# Patient Record
Sex: Female | Born: 1965 | Race: White | Marital: Married | Smoking: Current every day smoker
Health system: Southern US, Community
[De-identification: ages and names within clinical notes are randomized; demographics above are authoritative.]

## PROBLEM LIST (undated history)

## (undated) DIAGNOSIS — I10 Essential (primary) hypertension: Secondary | ICD-10-CM

## (undated) DIAGNOSIS — N200 Calculus of kidney: Secondary | ICD-10-CM

## (undated) DIAGNOSIS — K625 Hemorrhage of anus and rectum: Secondary | ICD-10-CM

## (undated) DIAGNOSIS — R51 Headache: Secondary | ICD-10-CM

## (undated) DIAGNOSIS — M549 Dorsalgia, unspecified: Secondary | ICD-10-CM

## (undated) HISTORY — PX: APPENDECTOMY: SHX54

## (undated) HISTORY — PX: ABDOMINAL HYSTERECTOMY: SHX81

## (undated) HISTORY — PX: BREAST LUMPECTOMY: SHX2

---

## 2004-12-23 HISTORY — PX: BREAST EXCISIONAL BIOPSY: SUR124

## 2012-09-15 ENCOUNTER — Ambulatory Visit
Admission: RE | Admit: 2012-09-15 | Discharge: 2012-09-15 | Disposition: A | Discharge status: Home / Self Care | Payer: No Typology Code available for payment source | Source: Ambulatory Visit | Attending: Family Medicine | Admitting: Family Medicine

## 2012-09-15 ENCOUNTER — Other Ambulatory Visit: Payer: Self-pay | Admitting: Family Medicine

## 2012-09-15 DIAGNOSIS — R109 Unspecified abdominal pain: Secondary | ICD-10-CM

## 2012-09-15 DIAGNOSIS — R1110 Vomiting, unspecified: Secondary | ICD-10-CM

## 2012-09-15 MED ORDER — IOHEXOL 300 MG/ML  SOLN
100.0000 mL | Freq: Once | INTRAMUSCULAR | Status: AC | PRN
  Administered 2012-09-15: 100 mL via INTRAVENOUS

## 2012-09-22 ENCOUNTER — Encounter (HOSPITAL_COMMUNITY): Payer: Self-pay | Admitting: Anesthesiology

## 2012-09-22 ENCOUNTER — Encounter (HOSPITAL_COMMUNITY)
Admission: RE | Disposition: A | Discharge status: Home / Self Care | Payer: Self-pay | Source: Ambulatory Visit | Attending: Gastroenterology

## 2012-09-22 ENCOUNTER — Encounter (HOSPITAL_COMMUNITY): Payer: Self-pay

## 2012-09-22 ENCOUNTER — Ambulatory Visit (HOSPITAL_COMMUNITY): Payer: Self-pay | Admitting: Anesthesiology

## 2012-09-22 ENCOUNTER — Ambulatory Visit (HOSPITAL_COMMUNITY)
Admission: RE | Admit: 2012-09-22 | Discharge: 2012-09-22 | Disposition: A | Discharge status: Home / Self Care | Payer: Self-pay | Source: Ambulatory Visit | Attending: Gastroenterology | Admitting: Gastroenterology

## 2012-09-22 DIAGNOSIS — K5730 Diverticulosis of large intestine without perforation or abscess without bleeding: Secondary | ICD-10-CM | POA: Insufficient documentation

## 2012-09-22 DIAGNOSIS — R109 Unspecified abdominal pain: Secondary | ICD-10-CM | POA: Insufficient documentation

## 2012-09-22 DIAGNOSIS — I10 Essential (primary) hypertension: Secondary | ICD-10-CM | POA: Insufficient documentation

## 2012-09-22 DIAGNOSIS — K648 Other hemorrhoids: Secondary | ICD-10-CM | POA: Insufficient documentation

## 2012-09-22 DIAGNOSIS — K921 Melena: Secondary | ICD-10-CM | POA: Insufficient documentation

## 2012-09-22 DIAGNOSIS — F17200 Nicotine dependence, unspecified, uncomplicated: Secondary | ICD-10-CM | POA: Insufficient documentation

## 2012-09-22 DIAGNOSIS — R197 Diarrhea, unspecified: Secondary | ICD-10-CM | POA: Insufficient documentation

## 2012-09-22 DIAGNOSIS — D126 Benign neoplasm of colon, unspecified: Secondary | ICD-10-CM | POA: Insufficient documentation

## 2012-09-22 HISTORY — DX: Dorsalgia, unspecified: M54.9

## 2012-09-22 HISTORY — DX: Headache: R51

## 2012-09-22 HISTORY — DX: Essential (primary) hypertension: I10

## 2012-09-22 HISTORY — DX: Hemorrhage of anus and rectum: K62.5

## 2012-09-22 HISTORY — DX: Calculus of kidney: N20.0

## 2012-09-22 HISTORY — PX: COLONOSCOPY: SHX5424

## 2012-09-22 SURGERY — COLONOSCOPY
Anesthesia: Monitor Anesthesia Care | Site: Rectum

## 2012-09-22 MED ORDER — SODIUM CHLORIDE 0.9 % IV SOLN: INTRAVENOUS | Status: DC

## 2012-09-22 MED ORDER — DIPHENHYDRAMINE HCL 50 MG/ML IJ SOLN
INTRAMUSCULAR | Status: AC
  Filled 2012-09-22: qty 1

## 2012-09-22 MED ORDER — MIDAZOLAM HCL 10 MG/2ML IJ SOLN
INTRAMUSCULAR | Status: AC
  Filled 2012-09-22: qty 4

## 2012-09-22 MED ORDER — FENTANYL CITRATE 0.05 MG/ML IJ SOLN
INTRAMUSCULAR | Status: DC | PRN
  Administered 2012-09-22: 25 ug via INTRAVENOUS

## 2012-09-22 MED ORDER — MIDAZOLAM HCL 5 MG/5ML IJ SOLN
INTRAMUSCULAR | Status: DC | PRN
  Administered 2012-09-22: 2 mg via INTRAVENOUS

## 2012-09-22 MED ORDER — FENTANYL CITRATE 0.05 MG/ML IJ SOLN
INTRAMUSCULAR | Status: AC
  Filled 2012-09-22: qty 4

## 2012-09-22 NOTE — Anesthesia Preprocedure Evaluation (Deleted)
Anesthesia Evaluation  Patient identified by MRN, date of birth, ID band Patient awake    Reviewed: Allergy & Precautions, H&P , NPO status , Patient's Chart, lab work & pertinent test results  Airway Mallampati: II TM Distance: >3 FB Neck ROM: Full    Dental  (+) Edentulous Upper and Dental Advisory Given   Pulmonary Current Smoker,  breath sounds clear to auscultation  Pulmonary exam normal       Cardiovascular hypertension, Pt. on medications - CAD, - Past MI and - CABG Rhythm:Regular Rate:Normal     Neuro/Psych  Headaches, negative psych ROS   GI/Hepatic negative GI ROS, Neg liver ROS,   Endo/Other  negative endocrine ROS  Renal/GU Renal disease     Musculoskeletal negative musculoskeletal ROS (+)   Abdominal   Peds  Hematology negative hematology ROS (+)   Anesthesia Other Findings   Reproductive/Obstetrics                          Anesthesia Physical Anesthesia Plan  ASA: II  Anesthesia Plan: MAC   Post-op Pain Management:    Induction:   Airway Management Planned:   Additional Equipment:   Intra-op Plan:   Post-operative Plan:   Informed Consent: I have reviewed the patients History and Physical, chart, labs and discussed the procedure including the risks, benefits and alternatives for the proposed anesthesia with the patient or authorized representative who has indicated his/her understanding and acceptance.   Dental advisory given  Plan Discussed with: CRNA and Surgeon  Anesthesia Plan Comments:         Anesthesia Quick Evaluation

## 2012-09-22 NOTE — Progress Notes (Addendum)
After giving one dose fentanyl plus versed 2mg  patient developed reddened area around IV site with burning and welts MD notified no further drugs given. Anesthesia department will give sedation later today.Due to previous add nos. Patient unable to be done today. Procedure rescheduled for 1215 on Wednesday October 2nd 2013

## 2012-09-22 NOTE — H&P (Signed)
Patient interval history reviewed.  Patient examined again.  There has been no change from documented H/P dated 09/18/12 (scanned into chart from our office) except as documented above.  Assessment:  1.  Left- sided abdominal pain. 2.  Blood in stool. 3.  Abnormal CT abdomen.  Sigmoid diverticulosis without diverticulitis.  Plan:  1.  Colonoscopy. 2.  Risks (bleeding, infection, bowel perforation that could require surgery, sedation-related changes in cardiopulmonary systems), benefits (identification and possible treatment of source of symptoms, exclusion of certain causes of symptoms), and alternatives (watchful waiting, radiographic imaging studies, empiric medical treatment) of colonoscopy were explained to patient in detail and she wishes to proceed.

## 2012-09-22 NOTE — Progress Notes (Signed)
A second IV started in preprocedure area . #22 Q cath inserted left inner forearm. IV flowing with difficulty. Patient's procedure will be done with anesthesia . Patient's procedure cancelled at 1330 rescheduled for Wednesday October 2nd at 1230 patient to arrive at 1030 am. IV fluids infused today 250cc. Home instructions reviewde with patient prior to discharge.

## 2012-09-23 ENCOUNTER — Ambulatory Visit (HOSPITAL_COMMUNITY)
Admission: RE | Admit: 2012-09-23 | Discharge: 2012-09-23 | Disposition: A | Discharge status: Home / Self Care | Payer: Self-pay | Source: Ambulatory Visit | Attending: Gastroenterology | Admitting: Gastroenterology

## 2012-09-23 ENCOUNTER — Encounter (HOSPITAL_COMMUNITY): Payer: Self-pay | Admitting: Gastroenterology

## 2012-09-23 ENCOUNTER — Ambulatory Visit (HOSPITAL_COMMUNITY): Payer: Self-pay | Admitting: Anesthesiology

## 2012-09-23 ENCOUNTER — Encounter (HOSPITAL_COMMUNITY): Payer: Self-pay | Admitting: Anesthesiology

## 2012-09-23 ENCOUNTER — Encounter (HOSPITAL_COMMUNITY)
Admission: RE | Disposition: A | Discharge status: Home / Self Care | Payer: Self-pay | Source: Ambulatory Visit | Attending: Gastroenterology

## 2012-09-23 DIAGNOSIS — R109 Unspecified abdominal pain: Secondary | ICD-10-CM | POA: Insufficient documentation

## 2012-09-23 DIAGNOSIS — K921 Melena: Secondary | ICD-10-CM | POA: Insufficient documentation

## 2012-09-23 DIAGNOSIS — R197 Diarrhea, unspecified: Secondary | ICD-10-CM | POA: Insufficient documentation

## 2012-09-23 DIAGNOSIS — D126 Benign neoplasm of colon, unspecified: Secondary | ICD-10-CM | POA: Insufficient documentation

## 2012-09-23 DIAGNOSIS — K648 Other hemorrhoids: Secondary | ICD-10-CM | POA: Insufficient documentation

## 2012-09-23 SURGERY — COLONOSCOPY WITH PROPOFOL
Anesthesia: Monitor Anesthesia Care

## 2012-09-23 MED ORDER — KETAMINE HCL 50 MG/ML IJ SOLN
INTRAMUSCULAR | Status: DC | PRN
  Administered 2012-09-23: 50 mg via INTRAMUSCULAR

## 2012-09-23 MED ORDER — SODIUM CHLORIDE 0.9 % IV SOLN: INTRAVENOUS | Status: DC

## 2012-09-23 MED ORDER — PROPOFOL 10 MG/ML IV BOLUS
INTRAVENOUS | Status: DC | PRN
  Administered 2012-09-23: 40 mg via INTRAVENOUS
  Administered 2012-09-23: 20 mg via INTRAVENOUS
  Administered 2012-09-23: 30 mg via INTRAVENOUS

## 2012-09-23 MED ORDER — PROPOFOL 10 MG/ML IV EMUL
INTRAVENOUS | Status: DC | PRN
  Administered 2012-09-23: 100 ug/kg/min via INTRAVENOUS

## 2012-09-23 MED ORDER — LACTATED RINGERS IV SOLN
INTRAVENOUS | Status: DC
  Administered 2012-09-23: 1000 mL via INTRAVENOUS

## 2012-09-23 MED ORDER — DICYCLOMINE HCL 10 MG PO CAPS: 10.0000 mg | ORAL_CAPSULE | Freq: Three times a day (TID) | ORAL | Status: DC

## 2012-09-23 SURGICAL SUPPLY — 21 items

## 2012-09-23 NOTE — Transfer of Care (Signed)
Immediate Anesthesia Transfer of Care Note  Patient: Aimee Ramirez  Procedure(s) Performed: Procedure(s) (LRB): COLONOSCOPY WITH PROPOFOL (N/A)  Patient Location: PACU  Anesthesia Type: MAC  Level of Consciousness: sedated, patient cooperative and responds to stimulaton  Airway & Oxygen Therapy: Patient Spontanous Breathing and Patient connected to face mask oxgen  Post-op Assessment: Report given to PACU RN and Post -op Vital signs reviewed and stable  Post vital signs: Reviewed and stable  Complications: No apparent anesthesia complications

## 2012-09-23 NOTE — Anesthesia Preprocedure Evaluation (Signed)
Anesthesia Evaluation  Patient identified by MRN, date of birth, ID band Patient awake    Reviewed: Allergy & Precautions, H&P , NPO status , Patient's Chart, lab work & pertinent test results  Airway Mallampati: II TM Distance: >3 FB Neck ROM: Full    Dental  (+) Edentulous Upper and Dental Advisory Given   Pulmonary Current Smoker,  breath sounds clear to auscultation  Pulmonary exam normal       Cardiovascular hypertension, Pt. on medications - CAD, - Past MI and - CABG Rhythm:Regular Rate:Normal     Neuro/Psych  Headaches, negative psych ROS   GI/Hepatic negative GI ROS, Neg liver ROS,   Endo/Other  negative endocrine ROS  Renal/GU Renal disease     Musculoskeletal negative musculoskeletal ROS (+)   Abdominal   Peds  Hematology negative hematology ROS (+)   Anesthesia Other Findings   Reproductive/Obstetrics                           Anesthesia Physical  Anesthesia Plan  ASA: II  Anesthesia Plan: MAC   Post-op Pain Management:    Induction: Intravenous  Airway Management Planned:   Additional Equipment:   Intra-op Plan:   Post-operative Plan:   Informed Consent: I have reviewed the patients History and Physical, chart, labs and discussed the procedure including the risks, benefits and alternatives for the proposed anesthesia with the patient or authorized representative who has indicated his/her understanding and acceptance.   Dental advisory given  Plan Discussed with: CRNA and Surgeon  Anesthesia Plan Comments:         Anesthesia Quick Evaluation

## 2012-09-23 NOTE — H&P (Signed)
Patient interval history reviewed.  Patient examined again.  There has been no change from documented H/P dated 09/18/12 (scanned into chart from our office) except as documented above.  Assessment:  1.  Bloody diarrhea. 2.  Diverticulosis without diverticulitis on recent CT scan.  Plan:  1.  Colonoscopy. 2.  Risks (bleeding, infection, bowel perforation that could require surgery, sedation-related changes in cardiopulmonary systems), benefits (identification and possible treatment of source of symptoms, exclusion of certain causes of symptoms), and alternatives (watchful waiting, radiographic imaging studies, empiric medical treatment) of colonoscopy were explained to patient in detail and patient wishes to proceed.

## 2012-09-23 NOTE — Op Note (Addendum)
[facility] [address] [place], [address]   COLONOSCOPY PROCEDURE REPORT  PATIENT: Aimee Ramirez, Aimee Ramirez  MR#: [id number] BIRTHDATE: 1966-05-16 , 45  yrs. old GENDER: Female ENDOSCOPIST: Willis Modena, MD REFERRED XB:[id number] Katrinka Blazing, M.D. PROCEDURE DATE:  09/23/2012 PROCEDURE:   Colonoscopy with biopsy and Colonoscopy with snare polypectomy ASA CLASS:   Class II INDICATIONS:unexplained diarrhea, hematochezia, and abdominal pain.  MEDICATIONS: MAC sedation, administered by CRNA DESCRIPTION OF PROCEDURE:   After the risks benefits and alternatives of the procedure were thoroughly explained, informed consent was obtained.  A digital rectal exam revealed no abnormalities of the rectum.   The     endoscope was introduced through the anus and advanced to the cecum, which was identified by both the appendix and ileocecal valve. No adverse events experienced.   The quality of the prep was good.  The instrument was then slowly withdrawn as the colon was fully examined.   Findings:  Normal digital rectal exam.  Prep quality good.  Few scattered left-sided diverticula.  10mm pedunculated sigmoid polyp, removed with snare cautery.  Sessile 5mm sigmoid polyp, removed with snare cautery.  5mm rectal polyp, removed with snare cautery. 3mm ascending colon polyp, removed with cold biopsy forceps.  No other masses, vascular ectasias, of inflammatory changes were seen.       Retroflexed views revealed internal hemorrhoids. Withdrawal time was over 10 minutes     .  The scope was withdrawn and the procedure completed.  ENDOSCOPIC IMPRESSION:     As above.  Suspect bleeding is from internal hemorrhoids, but polyps could potentially play role.   RECOMMENDATIONS:     1.  Watch for potential complications of procedure. 2.  Await polypectomy results. 3.  Topical therapies (e.g., Preparation-H) for hemorrhoidal bleeding. 4.  Timing of next colonoscopy pending polypectomy  findings. 5.  High fiber diet for diverticulosis. 6.  Trial of antispasmodics for abdominal pain. 7.  Follow-up with [facility] in 4-6 weeks.  eSigned:  Willis Modena, MD 09/23/2012 12:20 PM Revised: 09/23/2012 12:20 PMcc:

## 2012-09-23 NOTE — Anesthesia Postprocedure Evaluation (Signed)
  Anesthesia Post-op Note  Patient: Aimee Ramirez  Procedure(s) Performed: Procedure(s) (LRB): COLONOSCOPY WITH PROPOFOL (N/A)  Patient Location: PACU  Anesthesia Type: MAC  Level of Consciousness: awake and alert   Airway and Oxygen Therapy: Patient Spontanous Breathing  Post-op Pain: mild  Post-op Assessment: Post-op Vital signs reviewed, Patient's Cardiovascular Status Stable, Respiratory Function Stable, Patent Airway and No signs of Nausea or vomiting  Post-op Vital Signs: stable  Complications: No apparent anesthesia complications

## 2013-02-11 ENCOUNTER — Ambulatory Visit
Admission: RE | Admit: 2013-02-11 | Discharge: 2013-02-11 | Disposition: A | Discharge status: Home / Self Care | Payer: 59 | Source: Ambulatory Visit | Attending: Family Medicine | Admitting: Family Medicine

## 2013-02-11 ENCOUNTER — Other Ambulatory Visit: Payer: Self-pay | Admitting: Family Medicine

## 2013-02-11 DIAGNOSIS — R059 Cough, unspecified: Secondary | ICD-10-CM

## 2013-02-11 DIAGNOSIS — R0602 Shortness of breath: Secondary | ICD-10-CM

## 2013-02-11 DIAGNOSIS — R05 Cough: Secondary | ICD-10-CM

## 2013-02-11 DIAGNOSIS — F17200 Nicotine dependence, unspecified, uncomplicated: Secondary | ICD-10-CM

## 2013-08-09 ENCOUNTER — Encounter (HOSPITAL_COMMUNITY): Payer: Self-pay | Admitting: Emergency Medicine

## 2013-08-09 ENCOUNTER — Emergency Department (HOSPITAL_COMMUNITY): Payer: 59

## 2013-08-09 ENCOUNTER — Emergency Department (HOSPITAL_COMMUNITY)
Admission: EM | Admit: 2013-08-09 | Discharge: 2013-08-09 | Disposition: A | Discharge status: Home / Self Care | Payer: 59 | Attending: Emergency Medicine | Admitting: Emergency Medicine

## 2013-08-09 DIAGNOSIS — Y9389 Activity, other specified: Secondary | ICD-10-CM | POA: Insufficient documentation

## 2013-08-09 DIAGNOSIS — Z8719 Personal history of other diseases of the digestive system: Secondary | ICD-10-CM | POA: Insufficient documentation

## 2013-08-09 DIAGNOSIS — F17200 Nicotine dependence, unspecified, uncomplicated: Secondary | ICD-10-CM | POA: Insufficient documentation

## 2013-08-09 DIAGNOSIS — R0781 Pleurodynia: Secondary | ICD-10-CM

## 2013-08-09 DIAGNOSIS — Z79899 Other long term (current) drug therapy: Secondary | ICD-10-CM | POA: Insufficient documentation

## 2013-08-09 DIAGNOSIS — S298XXA Other specified injuries of thorax, initial encounter: Secondary | ICD-10-CM | POA: Insufficient documentation

## 2013-08-09 DIAGNOSIS — Z87442 Personal history of urinary calculi: Secondary | ICD-10-CM | POA: Insufficient documentation

## 2013-08-09 DIAGNOSIS — I10 Essential (primary) hypertension: Secondary | ICD-10-CM | POA: Insufficient documentation

## 2013-08-09 DIAGNOSIS — Y929 Unspecified place or not applicable: Secondary | ICD-10-CM | POA: Insufficient documentation

## 2013-08-09 DIAGNOSIS — R296 Repeated falls: Secondary | ICD-10-CM | POA: Insufficient documentation

## 2013-08-09 MED ORDER — PROMETHAZINE HCL 25 MG PO TABS: 25.0000 mg | ORAL_TABLET | Freq: Four times a day (QID) | ORAL | Status: DC | PRN

## 2013-08-09 MED ORDER — HYDROCODONE-ACETAMINOPHEN 5-325 MG PO TABS
2.0000 | ORAL_TABLET | Freq: Four times a day (QID) | ORAL | Status: DC | PRN
Start: 2013-08-09 — End: 2014-05-23

## 2013-08-09 NOTE — ED Provider Notes (Signed)
CSN: [id number]     Arrival date & time 08/09/13  1251 History     First MD Initiated Contact with Patient 08/09/13 1306     Chief Complaint  Patient presents with  . Rib Injury   (Consider location/radiation/quality/duration/timing/severity/associated sxs/prior Treatment) HPI Comments: Patient is a 47 year old female who presents today with left rib pain after falling getting out of her bathtub yesterday. She reports that the pain began immediately after she fell. This is a sharp pain worse with inspiration and palpation. Pain does not radiate. She has taken ibuprofen without relief. She denies feeling short of breath, chest pain, Diaphoresis, fevers, chills, hemoptysis, nausea, vomiting, abdominal pain. She has broken her ribs in the past and this feels like a broken rib. She has not had any recent surgeries, history of PE or DVT, long trips.   The history is provided by the patient. No language interpreter was used.    Past Medical History  Diagnosis Date  . Headache(784.0)   . Hypertension   . Rectal bleeding   . Kidney stone   . Kidney stone   . Back pain    Past Surgical History  Procedure Laterality Date  . Abdominal hysterectomy    . Appendectomy    . Breast lumpectomy    . Colonoscopy  09/22/2012    Procedure: COLONOSCOPY;  Surgeon: Willis Modena, MD;  Location: [facility];  Service: Endoscopy;  Laterality: N/A;   History reviewed. No pertinent family history. History  Substance Use Topics  . Smoking status: Current Every Day Smoker -- 0.50 packs/day  . Smokeless tobacco: Not on file  . Alcohol Use: No   OB History   Grav Para Term Preterm Abortions TAB SAB Ect Mult Living                 Review of Systems  Constitutional: Negative for fever and chills.  Respiratory: Negative for shortness of breath.   Cardiovascular: Negative for chest pain.  Gastrointestinal: Negative for nausea, vomiting and abdominal pain.  All other systems reviewed and are  negative.    Allergies  Fentanyl; Toradol; Versed; and Ace inhibitors  Home Medications   Current Outpatient Rx  Name  Route  Sig  Dispense  Refill  . diltiazem (CARDIZEM CD) 240 MG 24 hr capsule   Oral   Take 240 mg by mouth daily.         Marland Kitchen HYDROcodone-acetaminophen (NORCO/VICODIN) 5-325 MG per tablet   Oral   Take 1 tablet by mouth every 6 (six) hours as needed.         . meloxicam (MOBIC) 7.5 MG tablet   Oral   Take 7.5 mg by mouth daily.          BP 188/114  Pulse 99  Temp(Src) 98.7 F (37.1 C) (Oral)  Resp 18  SpO2 100% Physical Exam  Nursing note and vitals reviewed. Constitutional: She is oriented to person, place, and time. She appears well-developed and well-nourished. No distress.  HENT:  Head: Normocephalic and atraumatic.  Right Ear: External ear normal.  Left Ear: External ear normal.  Nose: Nose normal.  Mouth/Throat: Oropharynx is clear and moist.  Eyes: Conjunctivae are normal.  Neck: Normal range of motion.  Cardiovascular: Normal rate, regular rhythm and normal heart sounds.   Pulmonary/Chest: Effort normal and breath sounds normal. No stridor. No respiratory distress. She has no decreased breath sounds. She has no wheezes. She has no rales.  Tender to palpation along the left sixth rib  Abdominal: Soft. She exhibits no distension. There is no tenderness.  Musculoskeletal: Normal range of motion.  Neurological: She is alert and oriented to person, place, and time. She has normal strength.  Skin: Skin is warm and dry. She is not diaphoretic. No erythema.  Psychiatric: She has a normal mood and affect. Her behavior is normal.    ED Course   Procedures (including critical care time)  Labs Reviewed - No data to display Dg Ribs Unilateral W/chest Left  08/09/2013   *RADIOLOGY REPORT*  Clinical Data: On bathtub.  Mid left side rib pain.  LEFT RIBS AND CHEST - 3+ VIEW  Comparison: Chest x-ray from 02/11/2013  Findings: The lungs are clear  without focal infiltrate, edema, pneumothorax or pleural effusion. The cardiopericardial silhouette is within normal limits for size.  Nodular density overlying the left base was present previously and is unchanged, most compatible with a nipple shadow.  No evidence for acute displaced left-sided rib fracture.  IMPRESSION: No evidence for acute left rib fracture.   Original Report Authenticated By: Kennith Center, M.D.   1. Rib pain on left side     MDM  Patient with left rib pain. X-ray negative for fracture, pneumothorax. Discussed with patient that it is possible to have a rib fracture without seen on the x-ray. Discussed the treatment would be the same. She was encouraged to take deep breaths every hour on the hour to prevent pneumonia, atelectasis. Perc negative. She was given pain medication. Return instructions given. Vital signs stable for discharge. Patient / Family / Caregiver informed of clinical course, understand medical decision-making process, and agree with plan.   Mora Bellman, PA-C 08/10/13 1225

## 2013-08-09 NOTE — ED Notes (Signed)
Patient reports that she fell in her shower yesterday and had had left rib pain since

## 2013-08-10 NOTE — ED Provider Notes (Signed)
Medical screening examination/treatment/procedure(s) were performed by non-physician practitioner and as supervising physician I was immediately available for consultation/collaboration.    Lakota Schweppe R Kayla Weekes, MD 08/10/13 1541 

## 2013-12-28 ENCOUNTER — Ambulatory Visit (HOSPITAL_BASED_OUTPATIENT_CLINIC_OR_DEPARTMENT_OTHER): Payer: 59

## 2014-01-24 ENCOUNTER — Ambulatory Visit (HOSPITAL_BASED_OUTPATIENT_CLINIC_OR_DEPARTMENT_OTHER): Payer: 59 | Attending: Physical Medicine and Rehabilitation | Admitting: *Deleted

## 2014-01-24 DIAGNOSIS — G4733 Obstructive sleep apnea (adult) (pediatric): Secondary | ICD-10-CM

## 2014-01-30 DIAGNOSIS — G4733 Obstructive sleep apnea (adult) (pediatric): Secondary | ICD-10-CM

## 2014-01-30 NOTE — Sleep Study (Signed)
A       NAME: Aimee Ramirez DATE OF BIRTH:  1966-04-19 MEDICAL RECORD NUMBER [id number]  LOCATION: [facility]  PHYSICIAN: Kevonna Nolte D  DATE OF STUDY: 01/24/2014  SLEEP STUDY TYPE: Out of Center Sleep Test-unattended                REFERRING PHYSICIAN: Margaretha Sheffield, MD  INDICATION FOR STUDY:  hypersomnia with obstructive sleep apnea  EPWORTH SLEEPINESS SCORE:   5/24 HEIGHT:   5 feet 4 inches WEIGHT:   140 pounds   NECK SIZE:     MEDICATIONS: Charted for review  IMPRESSION:  1) Moderate obstructive sleep apnea/hypopnea syndrome. AHI 25.5 per hour.  A total of 170 events recorded including 2 central apneas, 55 obstructive apneas, 11 mixed apneas, 110 hypopneas. Events were seen in all sleep positions, especially supine and prone. 2) Snoring with oxygen desaturation to a nadir of 86%. 3) Regular cardiac rhythm with mean heart rate 69.9 per minute    RECOMMENDATION:   1) Score is in this range are usually addressed first with CPAP. This patient can be scheduled at the sleep disorders Center for a CPAP titration study if appropriate.    Signed Baird Lyons M.D. Deneise Lever Diplomate, American Board of Sleep Medicine  ELECTRONICALLY SIGNED ON:  01/30/2014, 12:17 PM [place] PH: [phone]   FX: [phone] [place]

## 2014-03-29 ENCOUNTER — Ambulatory Visit (HOSPITAL_BASED_OUTPATIENT_CLINIC_OR_DEPARTMENT_OTHER): Payer: 59 | Attending: Physical Medicine and Rehabilitation | Admitting: Radiology

## 2014-03-29 VITALS — Ht 63.0 in | Wt 135.0 lb

## 2014-03-29 DIAGNOSIS — G4733 Obstructive sleep apnea (adult) (pediatric): Secondary | ICD-10-CM | POA: Insufficient documentation

## 2014-04-02 DIAGNOSIS — G4710 Hypersomnia, unspecified: Secondary | ICD-10-CM

## 2014-04-02 DIAGNOSIS — G4733 Obstructive sleep apnea (adult) (pediatric): Secondary | ICD-10-CM

## 2014-04-02 DIAGNOSIS — G4730 Sleep apnea, unspecified: Secondary | ICD-10-CM

## 2014-04-02 NOTE — Sleep Study (Signed)
   NAME: Aimee Ramirez DATE OF BIRTH:  06-08-1966 MEDICAL RECORD NUMBER [id number]  LOCATION: [facility]  PHYSICIAN: Karyna Bessler D Rishav Rockefeller  DATE OF STUDY: 03/29/2014  SLEEP STUDY TYPE: Nocturnal Polysomnogram               REFERRING PHYSICIAN: Margaretha Sheffield, MD  INDICATION FOR STUDY: Hypersomnia with sleep apnea, for CPAP titration  EPWORTH SLEEPINESS SCORE:   5/24 HEIGHT: 5\' 3"  (160 cm)  WEIGHT: 135 lb (61.236 kg)    Body mass index is 23.92 kg/(m^2).  NECK SIZE: 12.5 in.  MEDICATIONS: Charted for review  SLEEP ARCHITECTURE: Total sleep time 333 minutes with sleep efficiency is 86%. Stage I was 24.8%, stage II 61.4%, stage III absent, REM 13.8% of total sleep time. Sleep latency 11.5 minutes, REM latency 77 minutes, awake after sleep onset 44 minutes, arousal index 21.6. Bedtime medication: None.  RESPIRATORY DATA: CPAP titration protocol. CPAP was titrated to 12 CWP, AHI 4.4 per hour. She wore a medium [employer] fullface mask with heated humidifier.  OXYGEN DATA: Snoring was prevented by CPAP and mean oxygen saturation held 93.8% on room air.  CARDIAC DATA: Sinus rhythm with PACs and PVCs  MOVEMENT/PARASOMNIA: No significant movement disturbance, no bathroom trips  IMPRESSION/ RECOMMENDATION:   1) Successful CPAP titration to 12 CWP, AHI 4.4 per hour. She wore a medium [employer]  fullface mask with heated humidifier. Snoring was prevented and mean oxygen saturation held 93.8% on room air. 2) Baseline polysomnogram on 01/24/14 recorded AHI 25.5 per hour. Body weight was 140 pounds for that study.  Signed Baird Lyons M.D. [place], [employer] of Sleep Medicine  ELECTRONICALLY SIGNED ON:  04/02/2014, 4:01 PM [place] PH: [phone]   FX: [phone] [place]

## 2014-05-23 ENCOUNTER — Ambulatory Visit (INDEPENDENT_AMBULATORY_CARE_PROVIDER_SITE_OTHER): Payer: 59 | Admitting: Internal Medicine

## 2014-05-23 ENCOUNTER — Encounter: Payer: Self-pay | Admitting: Internal Medicine

## 2014-05-23 ENCOUNTER — Encounter (INDEPENDENT_AMBULATORY_CARE_PROVIDER_SITE_OTHER): Payer: Self-pay

## 2014-05-23 VITALS — BP 128/76 | HR 85 | Ht 64.0 in | Wt 140.0 lb

## 2014-05-23 DIAGNOSIS — G4733 Obstructive sleep apnea (adult) (pediatric): Secondary | ICD-10-CM

## 2014-05-23 DIAGNOSIS — J449 Chronic obstructive pulmonary disease, unspecified: Secondary | ICD-10-CM

## 2014-05-23 DIAGNOSIS — IMO0001 Reserved for inherently not codable concepts without codable children: Secondary | ICD-10-CM

## 2014-05-23 DIAGNOSIS — G4700 Insomnia, unspecified: Secondary | ICD-10-CM

## 2014-05-23 DIAGNOSIS — Z720 Tobacco use: Secondary | ICD-10-CM | POA: Insufficient documentation

## 2014-05-23 DIAGNOSIS — F17200 Nicotine dependence, unspecified, uncomplicated: Secondary | ICD-10-CM

## 2014-05-23 MED ORDER — ZOLPIDEM TARTRATE 10 MG PO TABS: 10.0000 mg | ORAL_TABLET | Freq: Every evening | ORAL | Status: DC | PRN

## 2014-05-23 NOTE — Progress Notes (Signed)
05/23/14- 4 yoF smoker referred courtesy of Dr Greta Doom at [facility]. COMPLAINS OF:  Discuss sleep study resuts.  Has trouble initiating sleeping and staying asleep. 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 Denies physical discomfort in bed, stressful triggering event or etc. 1-2 cups coffee in AM. No sleep meds. 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 Tired by 7:30 after making supper- falls asleep TV. Denies other daytime sleepiness, depression/ anxiety. Does not smoke after bedtime. No thyroid hx. Dx'd COPD but still smokes 1/2 ppd. Some cough and wheeze. Advair diskus inhaler helps, used irregularly. Couldn't afford rescue inhaler. NPSG - Unattended Home study 01/24/14- AHI 25.5/ hr, weight 140 lbs. CPAP titration 03/29/14- 12 cwp, AHI 4.4/hr. Loud snore- her husband wakes her to stop. Worst on back. No ENT surgery Hypertension.  Prior to Admission medications   Medication Sig Start Date End Date Taking? Authorizing Provider  HYDROcodone-acetaminophen (NORCO/VICODIN) 5-325 MG per tablet Take 1 tablet by mouth every 6 (six) hours as needed.   Yes Historical Provider, MD  lisinopril (PRINIVIL,ZESTRIL) 20 MG tablet Take 20 mg by mouth daily.   Yes Historical Provider, MD  methocarbamol (ROBAXIN) 750 MG tablet 1 tablet 3 (three) times daily. 04/26/14  Yes Historical Provider, MD  zolpidem (AMBIEN) 10 MG tablet Take 1 tablet (10 mg total) by mouth at bedtime as needed for sleep. 05/23/14 06/22/14  Deneise Lever, MD   Past Medical History  Diagnosis Date  . Headache(784.0)   . Hypertension   . Rectal bleeding   . Kidney stone   . Kidney stone   . Back pain    Past Surgical History  Procedure Laterality Date  . Abdominal hysterectomy    . Appendectomy    . Breast lumpectomy    . Colonoscopy  09/22/2012    Procedure: COLONOSCOPY;  Surgeon: Arta Silence, MD;  Location: [facility];  Service: Endoscopy;  Laterality: N/A;   Family History  Problem Relation Age of Onset  . Cancer Mother     History   Social History  . Marital Status: Married    Spouse Name: N/A    Number of Children: N/A  . Years of Education: N/A   Occupational History  . Not on file.   Social History Main Topics  . Smoking status: Current Every Day Smoker -- 0.50 packs/day  . Smokeless tobacco: Not on file  . Alcohol Use: No  . Drug Use: No  . Sexual Activity:    Other Topics Concern  . Not on file   Social History Narrative  . No narrative on file   ROS-see HPI Constitutional:   No-   weight loss, night sweats, fevers, chills, +fatigue, lassitude. HEENT:   +headaches, difficulty swallowing, +tooth/dental problems, sore throat,       No-  sneezing, itching, ear ache, nasal congestion, post nasal drip,  CV:  No-   chest pain, orthopnea, PND, swelling in lower extremities, anasarca,                                  dizziness, palpitations Resp: + shortness of breath with exertion or at rest.              No-   productive cough,  No non-productive cough,  No- coughing up of blood.              No-   change in color of mucus.  No-  wheezing.   Skin: No-   rash or lesions. GI:  + heartburn, indigestion, No-abdominal pain, nausea, vomiting, diarrhea,                 change in bowel habits, loss of appetite GU: No-   dysuria, change in color of urine, no urgency or frequency.  No- flank pain. MS:  +joint pain or swelling.  No- decreased range of motion.  No- back pain. Neuro-     nothing unusual Psych:  No- change in mood or affect. No depression or anxiety.  No memory loss.  OBJ- Physical Exam General- Alert, Oriented, Affect-appropriate, Distress- none acute Skin- rash-none, lesions- none, excoriation- none Lymphadenopathy- none Head- atraumatic            Eyes- Gross vision intact, PERRLA, conjunctivae and secretions clear            Ears- Hearing, canals-normal            Nose- Clear, no-Septal dev, mucus, polyps, erosion, perforation             Throat- Mallampati II , mucosa clear ,  drainage- none, tonsils- atrophic Neck- flexible , trachea midline, no stridor , thyroid nl, carotid no bruit Chest - symmetrical excursion , unlabored           Heart/CV- RRR , no murmur , no gallop  , no rub, nl s1 s2                           - JVD- none , edema- none, stasis changes- none, varices- none           Lung- clear to P&A, wheeze- none, cough- none , dullness-none, rub- none           Chest wall-  Abd- tender-no, distended-no, bowel sounds-present, HSM- no Br/ Gen/ Rectal- Not done, not indicated Extrem- cyanosis- none, clubbing, none, atrophy- none, strength- nl Neuro- grossly intact to observation

## 2014-05-23 NOTE — Assessment & Plan Note (Signed)
Probably a second sleep problem, rather than consequence of OSA. Plan counseled sleep habits, smoking cessation, try [place]

## 2014-05-23 NOTE — Assessment & Plan Note (Signed)
Educated on physiology, medical concerns, driving responsibility, treatment, Plan- [id number]

## 2014-05-23 NOTE — Assessment & Plan Note (Signed)
Must stop smoking. OSA, hypertension and smoking are a bad cardiovascular risk together Plan- smoking cessation. Consider other interventions later.

## 2014-05-23 NOTE — Patient Instructions (Signed)
Order- new DME for CPAP 12 cwp , mask of choice, humidifier, supplies    Dx OSA  Script to try ambien 10 mg, 1/2 or 1 at bedtime as needed for sleep  Please try to skip more cigarettes. Lets see if you can quit.

## 2014-05-23 NOTE — Assessment & Plan Note (Signed)
Counseling emphasized

## 2014-06-27 ENCOUNTER — Encounter (INDEPENDENT_AMBULATORY_CARE_PROVIDER_SITE_OTHER): Payer: Self-pay

## 2014-06-27 ENCOUNTER — Encounter: Payer: Self-pay | Admitting: Internal Medicine

## 2014-06-27 ENCOUNTER — Ambulatory Visit (INDEPENDENT_AMBULATORY_CARE_PROVIDER_SITE_OTHER): Payer: 59 | Admitting: Internal Medicine

## 2014-06-27 VITALS — BP 124/78 | HR 88 | Ht 64.0 in | Wt 141.2 lb

## 2014-06-27 DIAGNOSIS — IMO0001 Reserved for inherently not codable concepts without codable children: Secondary | ICD-10-CM

## 2014-06-27 DIAGNOSIS — G4733 Obstructive sleep apnea (adult) (pediatric): Secondary | ICD-10-CM

## 2014-06-27 DIAGNOSIS — Z720 Tobacco use: Secondary | ICD-10-CM

## 2014-06-27 DIAGNOSIS — G4700 Insomnia, unspecified: Secondary | ICD-10-CM

## 2014-06-27 DIAGNOSIS — J449 Chronic obstructive pulmonary disease, unspecified: Secondary | ICD-10-CM

## 2014-06-27 DIAGNOSIS — F17200 Nicotine dependence, unspecified, uncomplicated: Secondary | ICD-10-CM

## 2014-06-27 MED ORDER — AMITRIPTYLINE HCL 25 MG PO TABS: 25.0000 mg | ORAL_TABLET | Freq: Every day | ORAL | Status: DC

## 2014-06-27 MED ORDER — TRAZODONE HCL 100 MG PO TABS: 100.0000 mg | ORAL_TABLET | Freq: Every day | ORAL | Status: DC

## 2014-06-27 NOTE — Patient Instructions (Signed)
Stop Ambien  Script for trazodone, 1/2 or 1 at bedtime  We can continue CPAP 12/ APS. Our goal is to get it comfortable, so that you can wear it all night, every night  Please keep trying to stop smoking

## 2014-06-27 NOTE — Progress Notes (Signed)
05/23/14- 26 yoF smoker referred courtesy of Dr Greta Doom at [place]. COMPLAINS OF:  Discuss sleep study resuts.  Has trouble initiating sleeping and staying asleep. 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 Denies physical discomfort in bed, stressful triggering event or etc. 1-2 cups coffee in AM. No sleep meds. 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 Tired by 7:30 after making supper- falls asleep TV. Denies other daytime sleepiness, depression/ anxiety. Does not smoke after bedtime. No thyroid hx. Dx'd COPD but still smokes 1/2 ppd. Some cough and wheeze. Advair diskus inhaler helps, used irregularly. Couldn't afford rescue inhaler. NPSG - Unattended Home study 01/24/14- AHI 25.5/ hr, weight 140 lbs. CPAP titration 03/29/14- 12 cwp, AHI 4.4/hr. Loud snore- her husband wakes her to stop. Worst on back. No ENT surgery Hypertension.   06/27/14-  75 yoF smoker followed for insomnia with OSA complicated by chronic pain, tobacco use FOLLOWS FOR:  Wearing CPAP 12/APS, 4 hours per night.  Had to get new mask due to problems with other mask and chin strap hopes to wear longer now with new mask Download confirms good control and adequate compliance CPAP 12. Not snoring. Insurance only pain 15 days of Ambien and she says she needs more.  ROS-see HPI Constitutional:   No-   weight loss, night sweats, fevers, chills, +fatigue, lassitude. HEENT:   +headaches, difficulty swallowing, +tooth/dental problems, sore throat,       No-  sneezing, itching, ear ache, nasal congestion, post nasal drip,  CV:  No-   chest pain, orthopnea, PND, swelling in lower extremities, anasarca,                                  dizziness, palpitations Resp: + shortness of breath with exertion or at rest.              No-   productive cough,  No non-productive cough,  No- coughing up of blood.              No-   change in color of mucus.  No- wheezing.   Skin: No-   rash or lesions. GI:  + heartburn, indigestion, No-abdominal pain,  nausea, vomiting,  GU: . MS:  +joint pain or swelling.   Neuro-     nothing unusual Psych:  No- change in mood or affect. No depression or anxiety.  No memory loss.  OBJ- Physical Exam General- Alert, Oriented, Affect-appropriate, Distress- none acute Skin- rash-none, lesions- none, excoriation- none Lymphadenopathy- none Head- atraumatic            Eyes- Gross vision intact, PERRLA, conjunctivae and secretions clear            Ears- Hearing, canals-normal            Nose- Clear, no-Septal dev, mucus, polyps, erosion, perforation             Throat- Mallampati II , mucosa clear , drainage- none, tonsils- atrophic Neck- flexible , trachea midline, no stridor , thyroid nl, carotid no bruit Chest - symmetrical excursion , unlabored           Heart/CV- RRR , no murmur , no gallop  , no rub, nl s1 s2                           - JVD- none , edema- none, stasis changes- none, varices- none  Lung- clear to P&A, wheeze- none, cough- none , dullness-none, rub- none           Chest wall-  Abd-  Br/ Gen/ Rectal- Not done, not indicated Extrem- cyanosis- none, clubbing, none, atrophy- none, strength- nl Neuro- grossly intact to observation

## 2014-08-29 ENCOUNTER — Emergency Department (HOSPITAL_COMMUNITY)
Admission: EM | Admit: 2014-08-29 | Discharge: 2014-08-30 | Disposition: A | Discharge status: Home / Self Care | Payer: 59 | Attending: Emergency Medicine | Admitting: Emergency Medicine

## 2014-08-29 ENCOUNTER — Emergency Department (HOSPITAL_COMMUNITY): Payer: 59

## 2014-08-29 ENCOUNTER — Encounter (HOSPITAL_COMMUNITY): Payer: Self-pay | Admitting: Emergency Medicine

## 2014-08-29 DIAGNOSIS — R112 Nausea with vomiting, unspecified: Secondary | ICD-10-CM | POA: Insufficient documentation

## 2014-08-29 DIAGNOSIS — Z79899 Other long term (current) drug therapy: Secondary | ICD-10-CM | POA: Insufficient documentation

## 2014-08-29 DIAGNOSIS — R109 Unspecified abdominal pain: Secondary | ICD-10-CM

## 2014-08-29 DIAGNOSIS — K219 Gastro-esophageal reflux disease without esophagitis: Secondary | ICD-10-CM | POA: Insufficient documentation

## 2014-08-29 DIAGNOSIS — R1032 Left lower quadrant pain: Secondary | ICD-10-CM | POA: Insufficient documentation

## 2014-08-29 DIAGNOSIS — I10 Essential (primary) hypertension: Secondary | ICD-10-CM | POA: Insufficient documentation

## 2014-08-29 DIAGNOSIS — Z87442 Personal history of urinary calculi: Secondary | ICD-10-CM | POA: Diagnosis not present

## 2014-08-29 DIAGNOSIS — F17200 Nicotine dependence, unspecified, uncomplicated: Secondary | ICD-10-CM | POA: Insufficient documentation

## 2014-08-29 LAB — COMPREHENSIVE METABOLIC PANEL
ALBUMIN: 3.7 g/dL (ref 3.5–5.2)
ALK PHOS: 64 U/L (ref 39–117)
ALT: 12 U/L (ref 0–35)
ANION GAP: 16 — AB (ref 5–15)
AST: 12 U/L (ref 0–37)
BUN: 18 mg/dL (ref 6–23)
CHLORIDE: 106 meq/L (ref 96–112)
CO2: 24 mEq/L (ref 19–32)
Calcium: 9 mg/dL (ref 8.4–10.5)
Creatinine, Ser: 0.63 mg/dL (ref 0.50–1.10)
GFR calc Af Amer: >90 mL/min (ref >90)
GFR calc non Af Amer: >90 mL/min (ref >90)
GLUCOSE: 97 mg/dL (ref 70–99)
POTASSIUM: 3.6 meq/L — AB (ref 3.7–5.3)
SODIUM: 146 meq/L (ref 137–147)
TOTAL PROTEIN: 6.9 g/dL (ref 6.0–8.3)

## 2014-08-29 LAB — CBC WITH DIFFERENTIAL/PLATELET
BASOS PCT: 0 % (ref 0–1)
Basophils Absolute: 0 10*3/uL (ref 0.0–0.1)
Eosinophils Absolute: 0.1 10*3/uL (ref 0.0–0.7)
Eosinophils Relative: 1 % (ref 0–5)
HCT: 43.1 % (ref 36.0–46.0)
HEMOGLOBIN: 14.9 g/dL (ref 12.0–15.0)
LYMPHS ABS: 3.3 10*3/uL (ref 0.7–4.0)
Lymphocytes Relative: 30 % (ref 12–46)
MCH: 32 pg (ref 26.0–34.0)
MCHC: 34.6 g/dL (ref 30.0–36.0)
MCV: 92.5 fL (ref 78.0–100.0)
MONOS PCT: 6 % (ref 3–12)
Monocytes Absolute: 0.7 10*3/uL (ref 0.1–1.0)
NEUTROS ABS: 6.9 10*3/uL (ref 1.7–7.7)
NEUTROS PCT: 63 % (ref 43–77)
PLATELETS: 265 10*3/uL (ref 150–400)
RBC: 4.66 MIL/uL (ref 3.87–5.11)
RDW: 14.3 % (ref 11.5–15.5)
WBC: 11 10*3/uL — ABNORMAL HIGH (ref 4.0–10.5)

## 2014-08-29 LAB — TROPONIN I

## 2014-08-29 LAB — LIPASE, BLOOD: Lipase: 49 U/L (ref 11–59)

## 2014-08-29 MED ORDER — SODIUM CHLORIDE 0.9 % IV BOLUS (SEPSIS)
1000.0000 mL | Freq: Once | INTRAVENOUS | Status: AC
Start: 2014-08-29 — End: 2014-08-30
  Administered 2014-08-29: 1000 mL via INTRAVENOUS

## 2014-08-29 MED ORDER — IOHEXOL 300 MG/ML  SOLN
50.0000 mL | Freq: Once | INTRAMUSCULAR | Status: AC | PRN
  Administered 2014-08-29: 50 mL via ORAL

## 2014-08-29 MED ORDER — MORPHINE SULFATE 4 MG/ML IJ SOLN
4.0000 mg | Freq: Once | INTRAMUSCULAR | Status: AC
  Administered 2014-08-29: 4 mg via INTRAVENOUS
  Filled 2014-08-29: qty 1

## 2014-08-29 NOTE — ED Provider Notes (Signed)
CSN: [id number]     Arrival date & time 08/29/14  1929 History   First MD Initiated Contact with Patient 08/29/14 2007     Chief Complaint  Patient presents with  . Abdominal Pain     (Consider location/radiation/quality/duration/timing/severity/associated sxs/prior Treatment) The history is provided by the patient. No language interpreter was used.  Aimee Ramirez is a 48 y/o F with PMhx of HTN, rectal bleeding, kidney stones presenting to the ED with abdominal pain that has been ongoing for the past month. Stated that the pain has increased starting earlier this morning. Patient reported that the abdominal pain is localized to the LLQ and belly button described as a constant pain, as if her stomach is on fire without radiation. Reported that spicy foods, dairy, and after eating makes the pain worse. Stated that she has been using Vicodin and PPIs as prescribed by Dr. Paulita Fujita, GI physician, that she saw approximately 3 weeks ago. Stated that she has been having nausea and vomiting - approximately 3 episodes today, NB/NB. Reported that she has been having sweating at night. Stated that she had her appendix removed. Stated that she had a hysterectomy in 1999. Denied alcohol use. Reported that she does smoke cigarettes. Denied chest pain, shortness of breath, difficulty breathing, chills, fever, diarrhea, melena, hematochezia. PCP Dr. Cipriano [facility] Dr. Paulita Fujita  Past Medical History  Diagnosis Date  . Headache(784.0)   . Hypertension   . Rectal bleeding   . Kidney stone   . Kidney stone   . Back pain    Past Surgical History  Procedure Laterality Date  . Abdominal hysterectomy    . Appendectomy    . Breast lumpectomy    . Colonoscopy  09/22/2012    Procedure: COLONOSCOPY;  Surgeon: Arta Silence, MD;  Location: [facility];  Service: Endoscopy;  Laterality: N/A;   Family History  Problem Relation Age of Onset  . Cancer Mother    History  Substance Use Topics  . Smoking status: Current  Every Day Smoker -- 0.50 packs/day  . Smokeless tobacco: Not on file  . Alcohol Use: No   OB History   Grav Para Term Preterm Abortions TAB SAB Ect Mult Living                 Review of Systems  Constitutional: Negative for fever and chills.  Respiratory: Negative for chest tightness and shortness of breath.   Cardiovascular: Negative for chest pain.  Gastrointestinal: Positive for nausea, vomiting and abdominal pain. Negative for diarrhea, constipation, blood in stool and anal bleeding.  Genitourinary: Negative for dysuria, decreased urine volume, vaginal bleeding, vaginal discharge and vaginal pain.  Musculoskeletal: Negative for back pain and neck pain.  Neurological: Negative for weakness.      Allergies  Fentanyl; Toradol; and Versed  Home Medications   Prior to Admission medications   Medication Sig Start Date End Date Taking? Authorizing Provider  HYDROcodone-acetaminophen (NORCO) 7.5-325 MG per tablet Take 1 tablet by mouth every 6 (six) hours as needed for moderate pain.   Yes Historical Provider, MD  lisinopril (PRINIVIL,ZESTRIL) 20 MG tablet Take 20 mg by mouth at bedtime.    Yes Historical Provider, MD  methocarbamol (ROBAXIN) 750 MG tablet 1 tablet 3 (three) times daily. 04/26/14  Yes Historical Provider, MD  methylPREDNISolone (MEDROL DOSEPAK) 4 MG tablet Take 4-16 mg by mouth as directed. follow package directions   Yes Historical Provider, MD  Multiple Vitamin (MULTIVITAMIN WITH MINERALS) TABS tablet Take 1 tablet by  mouth daily.   Yes Historical Provider, MD  traZODone (DESYREL) 100 MG tablet Take 1 tablet (100 mg total) by mouth at bedtime. 06/27/14  Yes Deneise Lever, MD  famotidine (PEPCID) 20 MG tablet Take 1 tablet (20 mg total) by mouth 2 (two) times daily. 08/30/14   Gideon Burstein, PA-C   BP 129/88  Pulse 68  Temp(Src) 98.6 F (37 C) (Oral)  Resp 10  SpO2 95% Physical Exam  Nursing note and vitals reviewed. Constitutional: She is oriented to person,  place, and time. She appears well-developed and well-nourished. No distress.  HENT:  Head: Normocephalic and atraumatic.  Mouth/Throat: Oropharynx is clear and moist. No oropharyngeal exudate.  Eyes: Conjunctivae and EOM are normal. Right eye exhibits no discharge. Left eye exhibits no discharge.  Neck: Normal range of motion. Neck supple. No tracheal deviation present.  Cardiovascular: Normal rate, regular rhythm and normal heart sounds.  Exam reveals no friction rub.   No murmur heard. Pulses:      Radial pulses are 2+ on the right side, and 2+ on the left side.  Pulmonary/Chest: Effort normal and breath sounds normal. No respiratory distress. She has no wheezes. She has no rales.  Abdominal: Soft. Bowel sounds are normal. She exhibits no distension. There is tenderness in the right upper quadrant, periumbilical area and left lower quadrant. There is no rebound and no guarding.  Negative abdominal distension  BS normoactive in all 4 quadrants Abdomen soft upon palpation  Tenderness upon palpation to the LLQ, periumbilical region, RUQ Positive guarding upon palpation to these region  Musculoskeletal: Normal range of motion.  Lymphadenopathy:    She has no cervical adenopathy.  Neurological: She is alert and oriented to person, place, and time. No cranial nerve deficit. She exhibits normal muscle tone. Coordination normal.  Skin: Skin is warm and dry. She is not diaphoretic.  Psychiatric: She has a normal mood and affect. Her behavior is normal. Thought content normal.    ED Course  Procedures (including critical care time)  Husband to pick up patient.   Results for orders placed during the hospital encounter of 08/29/14  TROPONIN I      Result Value Ref Range   Troponin I <0.30  <0.30 ng/mL  CBC WITH DIFFERENTIAL      Result Value Ref Range   WBC 11.0 (*) 4.0 - 10.5 K/uL   RBC 4.66  3.87 - 5.11 MIL/uL   Hemoglobin 14.9  12.0 - 15.0 g/dL   HCT 43.1  36.0 - 46.0 %   MCV 92.5   78.0 - 100.0 fL   MCH 32.0  26.0 - 34.0 pg   MCHC 34.6  30.0 - 36.0 g/dL   RDW 14.3  11.5 - 15.5 %   Platelets 265  150 - 400 K/uL   Neutrophils Relative % 63  43 - 77 %   Neutro Abs 6.9  1.7 - 7.7 K/uL   Lymphocytes Relative 30  12 - 46 %   Lymphs Abs 3.3  0.7 - 4.0 K/uL   Monocytes Relative 6  3 - 12 %   Monocytes Absolute 0.7  0.1 - 1.0 K/uL   Eosinophils Relative 1  0 - 5 %   Eosinophils Absolute 0.1  0.0 - 0.7 K/uL   Basophils Relative 0  0 - 1 %   Basophils Absolute 0.0  0.0 - 0.1 K/uL  COMPREHENSIVE METABOLIC PANEL      Result Value Ref Range   Sodium 146  137 -  147 mEq/L   Potassium 3.6 (*) 3.7 - 5.3 mEq/L   Chloride 106  96 - 112 mEq/L   CO2 24  19 - 32 mEq/L   Glucose, Bld 97  70 - 99 mg/dL   BUN 18  6 - 23 mg/dL   Creatinine, Ser 0.63  0.50 - 1.10 mg/dL   Calcium 9.0  8.4 - 10.5 mg/dL   Total Protein 6.9  6.0 - 8.3 g/dL   Albumin 3.7  3.5 - 5.2 g/dL   AST 12  0 - 37 U/L   ALT 12  0 - 35 U/L   Alkaline Phosphatase 64  39 - 117 U/L   Total Bilirubin <0.2 (*) 0.3 - 1.2 mg/dL   GFR calc non Af Amer >90  >90 mL/min   GFR calc Af Amer >90  >90 mL/min   Anion gap 16 (*) 5 - 15  LIPASE, BLOOD      Result Value Ref Range   Lipase 49  11 - 59 U/L  URINALYSIS, ROUTINE W REFLEX MICROSCOPIC      Result Value Ref Range   Color, Urine YELLOW  YELLOW   APPearance CLEAR  CLEAR   Specific Gravity, Urine 1.015  1.005 - 1.030   pH 6.0  5.0 - 8.0   Glucose, UA NEGATIVE  NEGATIVE mg/dL   Hgb urine dipstick NEGATIVE  NEGATIVE   Bilirubin Urine NEGATIVE  NEGATIVE   Ketones, ur NEGATIVE  NEGATIVE mg/dL   Protein, ur NEGATIVE  NEGATIVE mg/dL   Urobilinogen, UA 0.2  0.0 - 1.0 mg/dL   Nitrite NEGATIVE  NEGATIVE   Leukocytes, UA NEGATIVE  NEGATIVE  URINE RAPID DRUG SCREEN (HOSP PERFORMED)      Result Value Ref Range   Opiates POSITIVE (*) NONE DETECTED   Cocaine NONE DETECTED  NONE DETECTED   Benzodiazepines NONE DETECTED  NONE DETECTED   Amphetamines NONE DETECTED  NONE  DETECTED   Tetrahydrocannabinol NONE DETECTED  NONE DETECTED   Barbiturates NONE DETECTED  NONE DETECTED  TROPONIN I      Result Value Ref Range   Troponin I <0.30  <0.30 ng/mL    Labs Review Labs Reviewed  CBC WITH DIFFERENTIAL - Abnormal; Notable for the following:    WBC 11.0 (*)    All other components within normal limits  COMPREHENSIVE METABOLIC PANEL - Abnormal; Notable for the following:    Potassium 3.6 (*)    Total Bilirubin <0.2 (*)    Anion gap 16 (*)    All other components within normal limits  URINE RAPID DRUG SCREEN (HOSP PERFORMED) - Abnormal; Notable for the following:    Opiates POSITIVE (*)    All other components within normal limits  TROPONIN I  LIPASE, BLOOD  URINALYSIS, ROUTINE W REFLEX MICROSCOPIC  TROPONIN I    Imaging Review Ct Abdomen Pelvis W Contrast  08/30/2014   CLINICAL DATA:  Left lateral abdominal pain, mid abdominal pain, nausea and vomiting for 1 month. Diarrhea.  EXAM: CT ABDOMEN AND PELVIS WITH CONTRAST  TECHNIQUE: Multidetector CT imaging of the abdomen and pelvis was performed using the standard protocol following bolus administration of intravenous contrast.  CONTRAST:  141mL OMNIPAQUE IOHEXOL 300 MG/ML  SOLN  COMPARISON:  09/15/2012  FINDINGS: Atelectasis in the lung bases.  Small esophageal hiatal hernia.  Liver, spleen, gallbladder, pancreas, adrenal glands, kidneys, abdominal aorta, inferior vena cava, and retroperitoneal lymph nodes are unremarkable. Stomach and small bowel are normal for degree of distention. Stool-filled colon without  distention. No free air or free fluid in the abdomen.  Pelvis: Bladder wall is not thickened. No pelvic mass or lymphadenopathy. Scattered diverticula in the sigmoid colon without evidence of diverticulitis. Appendix is surgically absent. The mild degenerative changes in the lumbar spine. No destructive bone lesions.  IMPRESSION: No focal acute process demonstrated in the abdomen or pelvis.   Electronically  Signed   By: Lucienne Capers M.D.   On: 08/30/2014 02:10     EKG Interpretation   Date/Time:  Monday August 29 2014 20:17:06 EDT Ventricular Rate:  86 PR Interval:  157 QRS Duration: 93 QT Interval:  370 QTC Calculation: 442 R Axis:   85 Text Interpretation:  normal Sinus rhythm No old tracing to compare  Confirmed by Debby Freiberg [phone]) on 08/29/2014 8:21:10 PM      MDM   Final diagnoses:  Gastroesophageal reflux disease, esophagitis presence not specified  Abdominal pain, unspecified abdominal location    Medications  morphine 4 MG/ML injection 4 mg (4 mg Intravenous Given 08/29/14 2217)  sodium chloride 0.9 % bolus 1,000 mL (0 mLs Intravenous Stopped 08/30/14 0254)  iohexol (OMNIPAQUE) 300 MG/ML solution 50 mL (50 mLs Oral Contrast Given 08/29/14 2333)  iohexol (OMNIPAQUE) 300 MG/ML solution 100 mL (100 mLs Intravenous Contrast Given 08/30/14 0153)   Filed Vitals:   08/29/14 2155 08/29/14 2353 08/30/14 0229 08/30/14 0300  BP: 123/75 140/76 128/85 129/88  Pulse: 75 71  68  Temp: 98.6 F (37 C)     TempSrc: Oral     Resp: 11 18 12 10   SpO2: 98% 98% 94% 95%   This provider reviewed patient's chart. Patient had a CT abdomen and pelvis with contrast performed in 2013 that identified sigmoid diverticulosis. EKG noted normal sinus rhythm with a heart rate of 86 bpm. Troponin negative elevation. Second troponin negative elevation. CBC noted mildly elevated WBC of 11.0 - negative left shift. CMP negative findings. Lipase negative elevation. Urinalysis negative hemoglobin, negative, leukocytes. Urine drug screen positive for opiates. CT abdomen and pelvis with contrast negative for acute abdominal processes. Stool filled colon identified without distention. Negative findings for acute abnormalities on CT scan of the abdomen. Patient tolerated fluids PO without difficulty - negative episodes of emesis while in the ED setting. Suspicion to be GERD. Patient stable, afebrile. Patient  not septic appearing. Discharged patient with Pepcid. Referred to PCP and GI. Discussed diet. Discussed with patient to closely monitor symptoms and if symptoms are to worsen or change to report back to the ED - strict return instructions given.  Patient agreed to plan of care, understood, all questions answered.   Jamse Mead, PA-C 08/30/14 [place], PA-C 08/30/14 [place], PA-C 08/30/14 [phone]

## 2014-08-29 NOTE — ED Notes (Signed)
Bed: MN81 Expected date:  Expected time:  Means of arrival:  Comments: EMS abdominal pain/female

## 2014-08-29 NOTE — ED Notes (Signed)
Pt is unable to void at this time.  

## 2014-08-29 NOTE — ED Notes (Signed)
Per EMS pt reports having abdominal pain that she attributes to gastric reflux or gallbladder problem (family hx). Pt was given two packs of Prednisone and was told to take both at the same time (step down dose exact mg unknown). Pt "fainted" with EMS. When EMS attempted to open patient eyelids she forced them closed. Pt is alert oriented and ambulatory.

## 2014-08-29 NOTE — ED Notes (Signed)
Pt is aware that urine is needed for a sample, pt sts she gone to the bathroom but there was no urine cup for a specimen.

## 2014-08-30 ENCOUNTER — Encounter (HOSPITAL_COMMUNITY): Payer: Self-pay | Admitting: Radiology

## 2014-08-30 LAB — URINALYSIS, ROUTINE W REFLEX MICROSCOPIC
BILIRUBIN URINE: NEGATIVE
GLUCOSE, UA: NEGATIVE mg/dL
HGB URINE DIPSTICK: NEGATIVE
Ketones, ur: NEGATIVE mg/dL
Leukocytes, UA: NEGATIVE
Nitrite: NEGATIVE
Protein, ur: NEGATIVE mg/dL
SPECIFIC GRAVITY, URINE: 1.015 (ref 1.005–1.030)
UROBILINOGEN UA: 0.2 mg/dL (ref 0.0–1.0)
pH: 6 (ref 5.0–8.0)

## 2014-08-30 LAB — TROPONIN I: Troponin I: <0.3 ng/mL (ref <0.30)

## 2014-08-30 LAB — RAPID URINE DRUG SCREEN, HOSP PERFORMED
AMPHETAMINES: NOT DETECTED
BARBITURATES: NOT DETECTED
BENZODIAZEPINES: NOT DETECTED
COCAINE: NOT DETECTED
OPIATES: POSITIVE — AB
TETRAHYDROCANNABINOL: NOT DETECTED

## 2014-08-30 MED ORDER — FAMOTIDINE 20 MG PO TABS: 20.0000 mg | ORAL_TABLET | Freq: Two times a day (BID) | ORAL | Status: DC

## 2014-08-30 MED ORDER — IOHEXOL 300 MG/ML  SOLN
100.0000 mL | Freq: Once | INTRAMUSCULAR | Status: AC | PRN
  Administered 2014-08-30: 100 mL via INTRAVENOUS

## 2014-08-30 NOTE — Discharge Instructions (Signed)
Please call your doctor for a followup appointment within 24-48 hours. When you talk to your doctor please let them know that you were seen in the emergency department and have them acquire all of your records so that they can discuss the findings with you and formulate a treatment plan to fully care for your new and ongoing problems. Please call and set-up an appointment with GI Please rest and stay hydrated  Please take medications as prescribed Please decrease intake of fatty greasy foods, alcohol, spicy foods Please continue to monitor symptoms closely and if symptoms are to worsen or change (fever greater than 101, chills, sweating, nausea, vomiting, chest pain, shortness of breathe, difficulty breathing, weakness, numbness, tingling, worsening or changes to pain pattern, blood in the stools, black tarry stools, inability to keep food or fluids down) please report back to the Emergency Department immediately.    Abdominal Pain Many things can cause abdominal pain. Usually, abdominal pain is not caused by a disease and will improve without treatment. It can often be observed and treated at home. Your health care provider will do a physical exam and possibly order blood tests and X-rays to help determine the seriousness of your pain. However, in many cases, more time must pass before a clear cause of the pain can be found. Before that point, your health care provider may not know if you need more testing or further treatment. HOME CARE INSTRUCTIONS  Monitor your abdominal pain for any changes. The following actions may help to alleviate any discomfort you are experiencing:  Only take over-the-counter or prescription medicines as directed by your health care provider.  Do not take laxatives unless directed to do so by your health care provider.  Try a clear liquid diet (broth, tea, or water) as directed by your health care provider. Slowly move to a bland diet as tolerated. SEEK MEDICAL CARE  IF:  You have unexplained abdominal pain.  You have abdominal pain associated with nausea or diarrhea.  You have pain when you urinate or have a bowel movement.  You experience abdominal pain that wakes you in the night.  You have abdominal pain that is worsened or improved by eating food.  You have abdominal pain that is worsened with eating fatty foods.  You have a fever. SEEK IMMEDIATE MEDICAL CARE IF:   Your pain does not go away within 2 hours.  You keep throwing up (vomiting).  Your pain is felt only in portions of the abdomen, such as the right side or the left lower portion of the abdomen.  You pass bloody or black tarry stools. MAKE SURE YOU:  Understand these instructions.   Will watch your condition.   Will get help right away if you are not doing well or get worse.  Document Released: 09/18/2005 Document Revised: 12/14/2013 Document Reviewed: 08/18/2013 [facility] Patient Information 2015 [place], [place]. This information is not intended to replace advice given to you by your health care provider. Make sure you discuss any questions you have with your health care provider. Food Choices for Gastroesophageal Reflux Disease When you have gastroesophageal reflux disease (GERD), the foods you eat and your eating habits are very important. Choosing the right foods can help ease the discomfort of GERD. WHAT GENERAL GUIDELINES DO I NEED TO FOLLOW?  Choose fruits, vegetables, whole grains, low-fat dairy products, and low-fat meat, fish, and poultry.  Limit fats such as oils, salad dressings, butter, nuts, and avocado.  Keep a food diary to identify foods  that cause symptoms.  Avoid foods that cause reflux. These may be different for different people.  Eat frequent small meals instead of three large meals each day.  Eat your meals slowly, in a relaxed setting.  Limit fried foods.  Cook foods using methods other than frying.  Avoid drinking alcohol.  Avoid  drinking large amounts of liquids with your meals.  Avoid bending over or lying down until 2-3 hours after eating. WHAT FOODS ARE NOT RECOMMENDED? The following are some foods and drinks that may worsen your symptoms: Vegetables Tomatoes. Tomato juice. Tomato and spaghetti sauce. Chili peppers. Onion and garlic. Horseradish. Fruits Oranges, grapefruit, and lemon (fruit and juice). Meats High-fat meats, fish, and poultry. This includes hot dogs, ribs, ham, sausage, salami, and bacon. Dairy Whole milk and chocolate milk. Sour cream. Cream. Butter. Ice cream. Cream cheese.  Beverages Coffee and tea, with or without caffeine. Carbonated beverages or energy drinks. Condiments Hot sauce. Barbecue sauce.  Sweets/Desserts Chocolate and cocoa. Donuts. Peppermint and spearmint. Fats and Oils High-fat foods, including [place] fries and potato chips. Other Vinegar. Strong spices, such as black pepper, white pepper, red pepper, cayenne, curry powder, cloves, ginger, and chili powder. The items listed above may not be a complete list of foods and beverages to avoid. Contact your dietitian for more information. Document Released: 12/09/2005 Document Revised: 12/14/2013 Document Reviewed: 10/13/2013 [facility] Patient Information 2015 [place], [place]. This information is not intended to replace advice given to you by your health care provider. Make sure you discuss any questions you have with your health care provider.

## 2014-09-01 NOTE — ED Provider Notes (Signed)
Medical screening examination/treatment/procedure(s) were performed by non-physician practitioner and as supervising physician I was immediately available for consultation/collaboration.   EKG Interpretation   Date/Time:  Monday August 29 2014 20:17:06 EDT Ventricular Rate:  86 PR Interval:  157 QRS Duration: 93 QT Interval:  370 QTC Calculation: 442 R Axis:   85 Text Interpretation:  normal Sinus rhythm No old tracing to compare  Confirmed by Debby Freiberg [phone]) on 08/29/2014 8:21:10 PM        Debby Freiberg, MD 09/01/14 1247

## 2014-09-19 ENCOUNTER — Other Ambulatory Visit: Payer: Self-pay | Admitting: Internal Medicine

## 2014-09-19 NOTE — Telephone Encounter (Signed)
Ok to refill both. Please make sure I see once a year.

## 2014-09-19 NOTE — Telephone Encounter (Signed)
CY, Please advise if okay to refill. Thanks.  

## 2014-09-21 NOTE — Telephone Encounter (Signed)
I have called refills to pharmacy voicemail.

## 2014-10-09 NOTE — Assessment & Plan Note (Signed)
Counseled again

## 2014-10-09 NOTE — Assessment & Plan Note (Addendum)
Discussed basis for insomnia and reviewed good sleep habits again Plan-try changing Ambien, which insurance won't cover adequately, to trazodone with discussion

## 2014-10-09 NOTE — Assessment & Plan Note (Signed)
Emphasized importance of smoking cessation

## 2014-10-09 NOTE — Assessment & Plan Note (Signed)
We discussed goal of using CPAP all night every night. Hopefully new mask will make this easier for her.

## 2014-10-21 ENCOUNTER — Other Ambulatory Visit: Payer: Self-pay | Admitting: Gastroenterology

## 2014-10-28 ENCOUNTER — Ambulatory Visit: Payer: 59 | Admitting: Internal Medicine

## 2014-11-25 ENCOUNTER — Encounter: Payer: Self-pay | Admitting: Internal Medicine

## 2014-11-25 ENCOUNTER — Ambulatory Visit (INDEPENDENT_AMBULATORY_CARE_PROVIDER_SITE_OTHER): Payer: 59 | Admitting: Internal Medicine

## 2014-11-25 ENCOUNTER — Ambulatory Visit (INDEPENDENT_AMBULATORY_CARE_PROVIDER_SITE_OTHER)
Admission: RE | Admit: 2014-11-25 | Discharge: 2014-11-25 | Disposition: A | Discharge status: Home / Self Care | Payer: 59 | Source: Ambulatory Visit | Attending: Internal Medicine | Admitting: Internal Medicine

## 2014-11-25 VITALS — BP 122/68 | HR 75 | Ht 64.0 in | Wt 141.2 lb

## 2014-11-25 DIAGNOSIS — G4733 Obstructive sleep apnea (adult) (pediatric): Secondary | ICD-10-CM

## 2014-11-25 DIAGNOSIS — G4700 Insomnia, unspecified: Secondary | ICD-10-CM

## 2014-11-25 DIAGNOSIS — Z720 Tobacco use: Secondary | ICD-10-CM

## 2014-11-25 MED ORDER — TRAZODONE HCL 100 MG PO TABS: ORAL_TABLET | ORAL | Status: DC

## 2014-11-25 NOTE — Assessment & Plan Note (Signed)
Trazodone 200 mg seems to be a good dose for her and may help a component of depression, supporting our efforts towards smoking cessation

## 2014-11-25 NOTE — Assessment & Plan Note (Signed)
CPAP 12 seems appropriate, sufficient and compliance is good Plan-download

## 2014-11-25 NOTE — Progress Notes (Signed)
05/23/14- 55 yoF smoker referred courtesy of Dr Greta Doom at [facility]. COMPLAINS OF:  Discuss sleep study resuts.  Has trouble initiating sleeping and staying asleep. 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 Denies physical discomfort in bed, stressful triggering event or etc. 1-2 cups coffee in AM. No sleep meds. 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 Tired by 7:30 after making supper- falls asleep TV. Denies other daytime sleepiness, depression/ anxiety. Does not smoke after bedtime. No thyroid hx. Dx'd COPD but still smokes 1/2 ppd. Some cough and wheeze. Advair diskus inhaler helps, used irregularly. Couldn't afford rescue inhaler. NPSG - Unattended Home study 01/24/14- AHI 25.5/ hr, weight 140 lbs. CPAP titration 03/29/14- 12 cwp, AHI 4.4/hr. Loud snore- her husband wakes her to stop. Worst on back. No ENT surgery Hypertension.   06/27/14-  53 yoF smoker followed for insomnia with OSA complicated by chronic pain, tobacco use FOLLOWS FOR:  Wearing CPAP 12/APS, 4 hours per night.  Had to get new mask due to problems with other mask and chin strap hopes to wear longer now with new mask Download confirms good control and adequate compliance CPAP 12. Not snoring. Insurance only pain 15 days of Ambien and she says she needs more.  11/25/14- 26 yoF smoker followed for insomnia with OSA complicated by chronic pain, tobacco use FOLLOWS FOR: Wears CPAP12/ APS every night for about 6-7 hours; pressure okay for patient. No supplies needed today. DME is [facility]-will need to get download and enroll in airview  declines flu shot Still smoking against advice-discussed. Mild morning cough. Amitriptyline caused nausea. Trazodone helps her sleep well but to be comfortable she needs 200 mg per night- well-tolerated.  ROS-see HPI Constitutional:   No-   weight loss, night sweats, fevers, chills, +fatigue, lassitude. HEENT:   +headaches, difficulty swallowing, +tooth/dental problems, sore throat,       No-  sneezing,  itching, ear ache, nasal congestion, post nasal drip,  CV:  No-   chest pain, orthopnea, PND, swelling in lower extremities, anasarca,                                  dizziness, palpitations Resp: + shortness of breath with exertion or at rest.              No-   productive cough,  No non-productive cough,  No- coughing up of blood.              No-   change in color of mucus.  No- wheezing.   Skin: No-   rash or lesions. GI:  + heartburn, indigestion, No-abdominal pain, nausea, vomiting,  GU: . MS:  +joint pain or swelling.   Neuro-     nothing unusual Psych:  No- change in mood or affect. No depression or anxiety.  No memory loss.  OBJ- Physical Exam     + strong tobacco odor General- Alert, Oriented, Affect-appropriate, Distress- none acute Skin- rash-none, lesions- none, excoriation- none Lymphadenopathy- none Head- atraumatic            Eyes- Gross vision intact, PERRLA, conjunctivae and secretions clear            Ears- Hearing, canals-normal            Nose- Clear, no-Septal dev, mucus, polyps, erosion, perforation             Throat- Mallampati II , mucosa clear , drainage-  none, tonsils- atrophic Neck- flexible , trachea midline, no stridor , thyroid nl, carotid no bruit Chest - symmetrical excursion , unlabored           Heart/CV- RRR , no murmur , no gallop  , no rub, nl s1 s2                           - JVD- none , edema- none, stasis changes- none, varices- none           Lung- clear to P&A, wheeze- none, cough- none , dullness-none, rub- none           Chest wall-  Abd-  Br/ Gen/ Rectal- Not done, not indicated Extrem- cyanosis- none, clubbing, none, atrophy- none, strength- nl Neuro- grossly intact to observation

## 2014-11-25 NOTE — Patient Instructions (Signed)
Script sent increasing trazodone to 2 x 100 mg for sleep as needed  We can continue CPAP 12/ APS  Order- APS download for pressure compliance and enroll in AirView   Dx OSA  Order- CXR  Dx tobacco user  Please keep trying to stop smoking ! We can help if needed.

## 2014-11-25 NOTE — Assessment & Plan Note (Signed)
She is not making an effort to stop smoking. I expressed concern and discussed available support.

## 2014-11-29 ENCOUNTER — Other Ambulatory Visit: Payer: Self-pay | Admitting: Internal Medicine

## 2014-11-29 DIAGNOSIS — J441 Chronic obstructive pulmonary disease with (acute) exacerbation: Secondary | ICD-10-CM

## 2014-12-02 ENCOUNTER — Other Ambulatory Visit: Payer: Self-pay | Admitting: Internal Medicine

## 2014-12-02 ENCOUNTER — Telehealth: Payer: Self-pay | Admitting: Internal Medicine

## 2014-12-02 ENCOUNTER — Ambulatory Visit (INDEPENDENT_AMBULATORY_CARE_PROVIDER_SITE_OTHER)
Admission: RE | Admit: 2014-12-02 | Discharge: 2014-12-02 | Disposition: A | Discharge status: Home / Self Care | Payer: 59 | Source: Ambulatory Visit | Attending: Internal Medicine | Admitting: Internal Medicine

## 2014-12-02 DIAGNOSIS — J441 Chronic obstructive pulmonary disease with (acute) exacerbation: Secondary | ICD-10-CM

## 2014-12-02 NOTE — Telephone Encounter (Signed)
Will forward to CY as an FYI>  Nothing further is needed.

## 2014-12-03 NOTE — Telephone Encounter (Signed)
noted 

## 2014-12-05 NOTE — Telephone Encounter (Signed)
Will sign off. Nothing further needed.  

## 2015-06-02 ENCOUNTER — Ambulatory Visit: Payer: 59 | Admitting: Internal Medicine

## 2015-09-03 IMAGING — CR DG CHEST 2V
2 series · 2 of 2 positions shown · non-contrast
Comparison: August 09, 2013

CLINICAL DATA: Wheezing; hypertension

EXAM:
CHEST  2 VIEW

[view not recorded (1 of 2)]
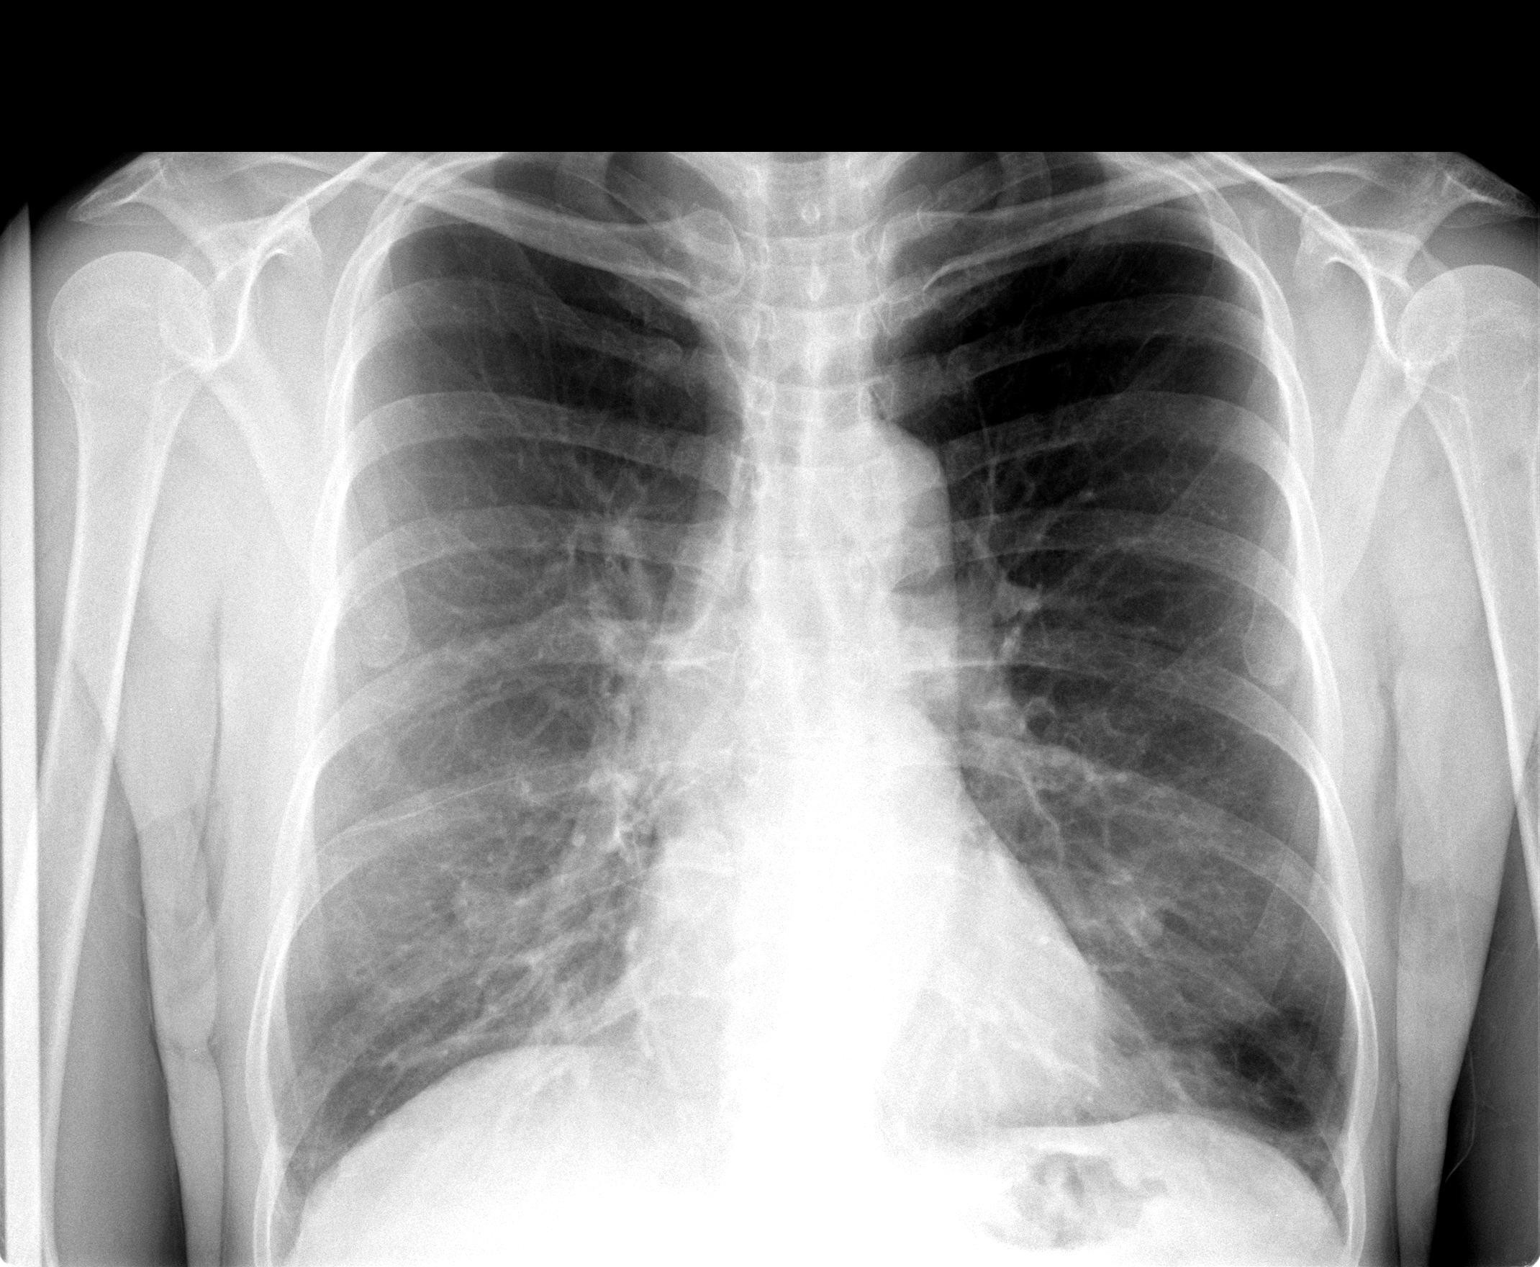

[view not recorded (2 of 2)]
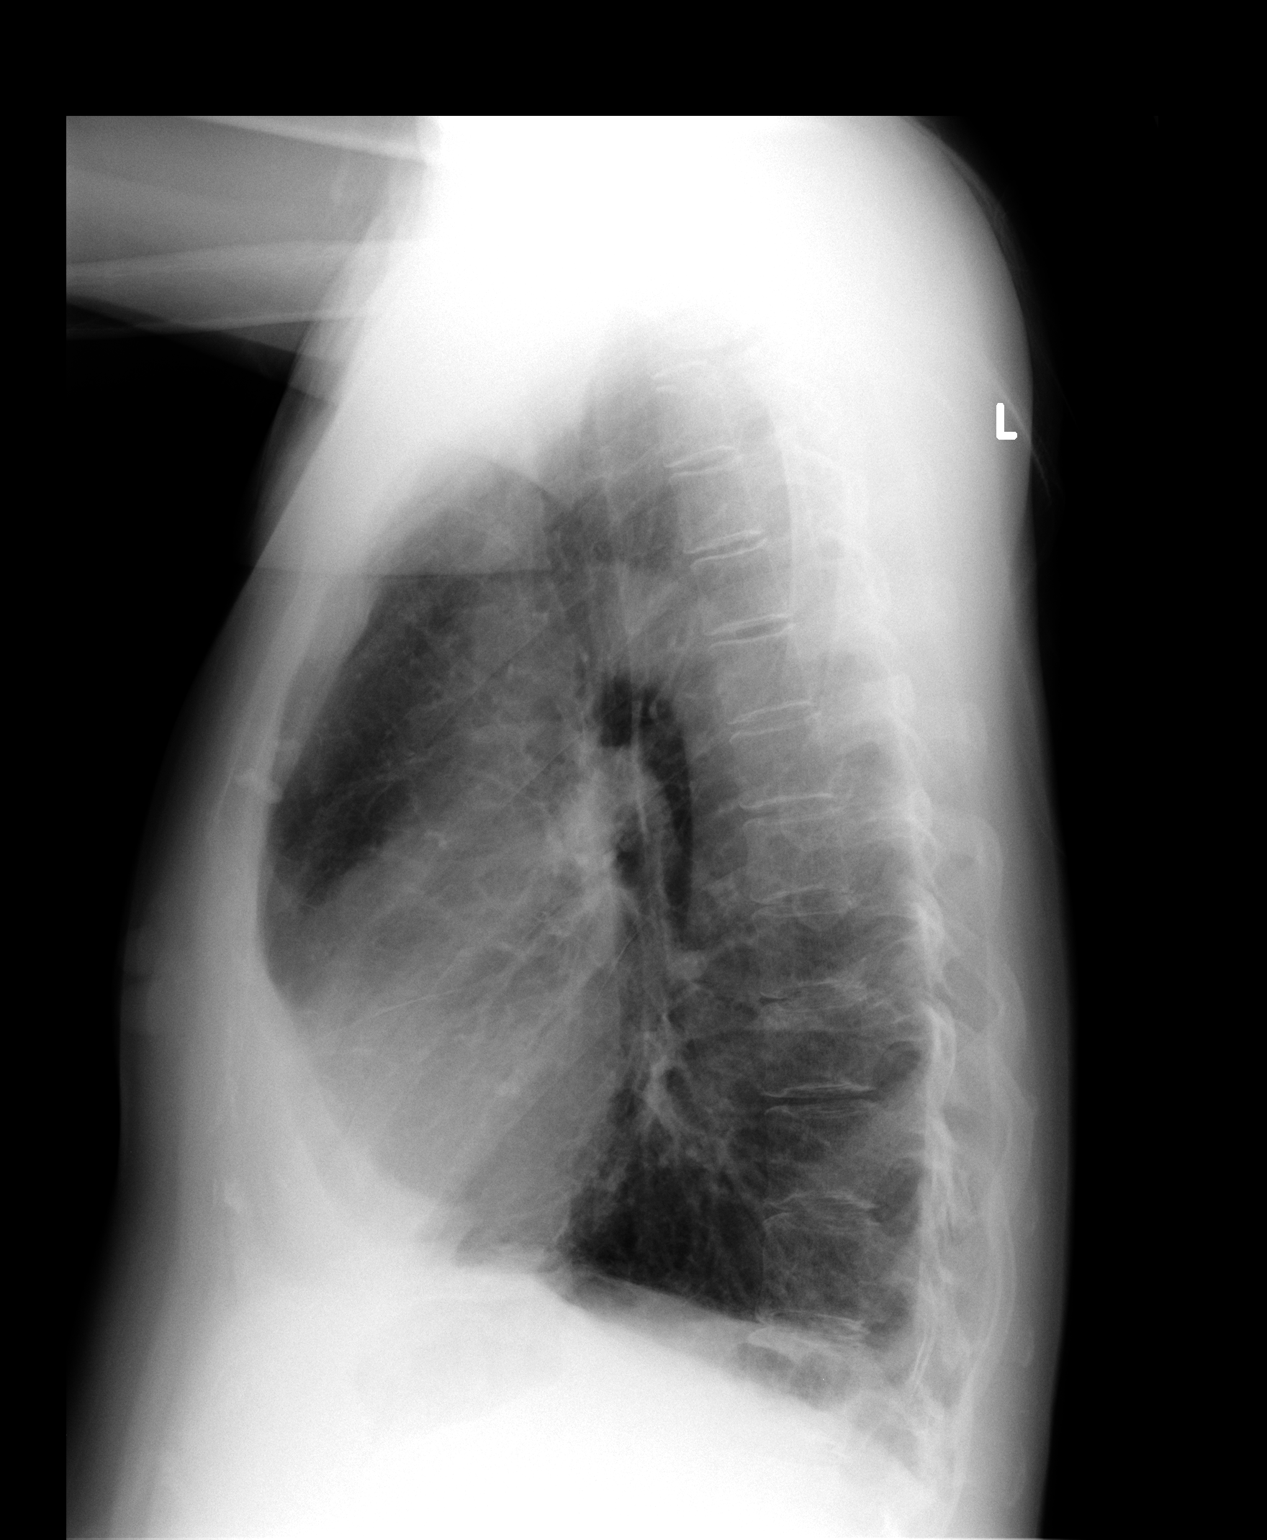

[2 of 2 positions shown; findings below may reference images not displayed]

FINDINGS: Nodular opacities are noted overlying both lower lung zones; suspect
nipple shadows.

There is slight scarring in the left base. There is evidence of a
degree of underlying emphysematous change. There is no edema or
consolidation. The heart size is normal. Diminished pulmonary
vascularity in the upper lobes suggests underlying emphysematous
change. No adenopathy. No bone lesions.
IMPRESSION: Underlying emphysematous change. Probable nipple shadows
bilaterally; repeat study with nipple markers would be helpful to
confirm this observation. No edema or consolidation. Slight scarring
left base.

## 2015-12-25 ENCOUNTER — Other Ambulatory Visit: Payer: Self-pay | Admitting: Internal Medicine

## 2015-12-27 NOTE — Telephone Encounter (Signed)
Refill request received for Trazadone. Pt has not been seen in >35yr and has no follow-up scheduled. Please advise. Thank you.

## 2015-12-27 NOTE — Telephone Encounter (Signed)
Ok to refll trazodone total 6 months

## 2016-06-23 ENCOUNTER — Other Ambulatory Visit: Payer: Self-pay | Admitting: Internal Medicine

## 2016-12-24 DIAGNOSIS — G4733 Obstructive sleep apnea (adult) (pediatric): Secondary | ICD-10-CM | POA: Diagnosis not present

## 2017-01-15 DIAGNOSIS — M47816 Spondylosis without myelopathy or radiculopathy, lumbar region: Secondary | ICD-10-CM | POA: Diagnosis not present

## 2017-01-15 DIAGNOSIS — M5136 Other intervertebral disc degeneration, lumbar region: Secondary | ICD-10-CM | POA: Diagnosis not present

## 2017-01-15 DIAGNOSIS — G894 Chronic pain syndrome: Secondary | ICD-10-CM | POA: Diagnosis not present

## 2017-05-20 DIAGNOSIS — G894 Chronic pain syndrome: Secondary | ICD-10-CM | POA: Diagnosis not present

## 2017-05-20 DIAGNOSIS — M47816 Spondylosis without myelopathy or radiculopathy, lumbar region: Secondary | ICD-10-CM | POA: Diagnosis not present

## 2017-05-20 DIAGNOSIS — M5136 Other intervertebral disc degeneration, lumbar region: Secondary | ICD-10-CM | POA: Diagnosis not present

## 2017-07-04 DIAGNOSIS — M47816 Spondylosis without myelopathy or radiculopathy, lumbar region: Secondary | ICD-10-CM | POA: Diagnosis not present

## 2017-07-04 DIAGNOSIS — M7061 Trochanteric bursitis, right hip: Secondary | ICD-10-CM | POA: Diagnosis not present

## 2017-07-04 DIAGNOSIS — G894 Chronic pain syndrome: Secondary | ICD-10-CM | POA: Diagnosis not present

## 2017-07-04 DIAGNOSIS — M5136 Other intervertebral disc degeneration, lumbar region: Secondary | ICD-10-CM | POA: Diagnosis not present

## 2017-07-09 DIAGNOSIS — G4733 Obstructive sleep apnea (adult) (pediatric): Secondary | ICD-10-CM | POA: Diagnosis not present

## 2017-08-15 DIAGNOSIS — B029 Zoster without complications: Secondary | ICD-10-CM | POA: Diagnosis not present

## 2017-08-28 DIAGNOSIS — R21 Rash and other nonspecific skin eruption: Secondary | ICD-10-CM | POA: Diagnosis not present

## 2017-08-29 DIAGNOSIS — M47816 Spondylosis without myelopathy or radiculopathy, lumbar region: Secondary | ICD-10-CM | POA: Diagnosis not present

## 2017-08-29 DIAGNOSIS — G894 Chronic pain syndrome: Secondary | ICD-10-CM | POA: Diagnosis not present

## 2017-08-29 DIAGNOSIS — M5136 Other intervertebral disc degeneration, lumbar region: Secondary | ICD-10-CM | POA: Diagnosis not present

## 2017-08-29 DIAGNOSIS — Z79891 Long term (current) use of opiate analgesic: Secondary | ICD-10-CM | POA: Diagnosis not present

## 2017-09-08 DIAGNOSIS — G894 Chronic pain syndrome: Secondary | ICD-10-CM | POA: Diagnosis not present

## 2017-09-08 DIAGNOSIS — Z79891 Long term (current) use of opiate analgesic: Secondary | ICD-10-CM | POA: Diagnosis not present

## 2017-09-08 DIAGNOSIS — M5136 Other intervertebral disc degeneration, lumbar region: Secondary | ICD-10-CM | POA: Diagnosis not present

## 2017-10-27 DIAGNOSIS — G894 Chronic pain syndrome: Secondary | ICD-10-CM | POA: Diagnosis not present

## 2017-10-27 DIAGNOSIS — M5136 Other intervertebral disc degeneration, lumbar region: Secondary | ICD-10-CM | POA: Diagnosis not present

## 2017-10-27 DIAGNOSIS — M47816 Spondylosis without myelopathy or radiculopathy, lumbar region: Secondary | ICD-10-CM | POA: Diagnosis not present

## 2017-10-27 DIAGNOSIS — M7062 Trochanteric bursitis, left hip: Secondary | ICD-10-CM | POA: Diagnosis not present

## 2017-11-06 DIAGNOSIS — Z86010 Personal history of colonic polyps: Secondary | ICD-10-CM | POA: Diagnosis not present

## 2017-11-06 DIAGNOSIS — K5730 Diverticulosis of large intestine without perforation or abscess without bleeding: Secondary | ICD-10-CM | POA: Diagnosis not present

## 2017-11-26 DIAGNOSIS — R2231 Localized swelling, mass and lump, right upper limb: Secondary | ICD-10-CM | POA: Diagnosis not present

## 2017-11-26 DIAGNOSIS — M25521 Pain in right elbow: Secondary | ICD-10-CM | POA: Diagnosis not present

## 2017-11-26 DIAGNOSIS — M79601 Pain in right arm: Secondary | ICD-10-CM | POA: Diagnosis not present

## 2017-11-27 DIAGNOSIS — R2231 Localized swelling, mass and lump, right upper limb: Secondary | ICD-10-CM | POA: Diagnosis not present

## 2017-12-04 DIAGNOSIS — Z01812 Encounter for preprocedural laboratory examination: Secondary | ICD-10-CM | POA: Diagnosis not present

## 2017-12-04 DIAGNOSIS — M25521 Pain in right elbow: Secondary | ICD-10-CM | POA: Diagnosis not present

## 2017-12-05 DIAGNOSIS — R2232 Localized swelling, mass and lump, left upper limb: Secondary | ICD-10-CM | POA: Diagnosis not present

## 2017-12-06 ENCOUNTER — Other Ambulatory Visit: Payer: Self-pay | Admitting: Physician Assistant

## 2017-12-06 DIAGNOSIS — R2232 Localized swelling, mass and lump, left upper limb: Secondary | ICD-10-CM

## 2017-12-08 DIAGNOSIS — M25551 Pain in right hip: Secondary | ICD-10-CM | POA: Diagnosis not present

## 2017-12-12 ENCOUNTER — Ambulatory Visit
Admission: RE | Admit: 2017-12-12 | Discharge: 2017-12-12 | Disposition: A | Discharge status: Home / Self Care | Payer: 59 | Source: Ambulatory Visit | Attending: Physician Assistant | Admitting: Physician Assistant

## 2017-12-12 ENCOUNTER — Other Ambulatory Visit: Payer: Self-pay | Admitting: Physician Assistant

## 2017-12-12 DIAGNOSIS — R2232 Localized swelling, mass and lump, left upper limb: Secondary | ICD-10-CM

## 2017-12-12 DIAGNOSIS — N6489 Other specified disorders of breast: Secondary | ICD-10-CM | POA: Diagnosis not present

## 2017-12-12 DIAGNOSIS — N6310 Unspecified lump in the right breast, unspecified quadrant: Secondary | ICD-10-CM

## 2017-12-12 DIAGNOSIS — R922 Inconclusive mammogram: Secondary | ICD-10-CM | POA: Diagnosis not present

## 2017-12-18 ENCOUNTER — Ambulatory Visit
Admission: RE | Admit: 2017-12-18 | Discharge: 2017-12-18 | Disposition: A | Discharge status: Home / Self Care | Payer: 59 | Source: Ambulatory Visit | Attending: Physician Assistant | Admitting: Physician Assistant

## 2017-12-18 ENCOUNTER — Other Ambulatory Visit: Payer: Self-pay | Admitting: Physician Assistant

## 2017-12-18 DIAGNOSIS — N6310 Unspecified lump in the right breast, unspecified quadrant: Secondary | ICD-10-CM

## 2017-12-18 DIAGNOSIS — R590 Localized enlarged lymph nodes: Secondary | ICD-10-CM | POA: Diagnosis not present

## 2017-12-18 DIAGNOSIS — L042 Acute lymphadenitis of upper limb: Secondary | ICD-10-CM | POA: Diagnosis not present

## 2017-12-24 DIAGNOSIS — M47816 Spondylosis without myelopathy or radiculopathy, lumbar region: Secondary | ICD-10-CM | POA: Diagnosis not present

## 2017-12-24 DIAGNOSIS — M5136 Other intervertebral disc degeneration, lumbar region: Secondary | ICD-10-CM | POA: Diagnosis not present

## 2017-12-24 DIAGNOSIS — G894 Chronic pain syndrome: Secondary | ICD-10-CM | POA: Diagnosis not present

## 2018-02-18 DIAGNOSIS — M47816 Spondylosis without myelopathy or radiculopathy, lumbar region: Secondary | ICD-10-CM | POA: Diagnosis not present

## 2018-02-18 DIAGNOSIS — G894 Chronic pain syndrome: Secondary | ICD-10-CM | POA: Diagnosis not present

## 2018-02-18 DIAGNOSIS — M5136 Other intervertebral disc degeneration, lumbar region: Secondary | ICD-10-CM | POA: Diagnosis not present

## 2018-04-15 DIAGNOSIS — G894 Chronic pain syndrome: Secondary | ICD-10-CM | POA: Diagnosis not present

## 2018-04-15 DIAGNOSIS — M5136 Other intervertebral disc degeneration, lumbar region: Secondary | ICD-10-CM | POA: Diagnosis not present

## 2018-04-15 DIAGNOSIS — M47816 Spondylosis without myelopathy or radiculopathy, lumbar region: Secondary | ICD-10-CM | POA: Diagnosis not present

## 2018-05-13 DIAGNOSIS — G894 Chronic pain syndrome: Secondary | ICD-10-CM | POA: Diagnosis not present

## 2018-05-13 DIAGNOSIS — M5136 Other intervertebral disc degeneration, lumbar region: Secondary | ICD-10-CM | POA: Diagnosis not present

## 2018-05-13 DIAGNOSIS — M47816 Spondylosis without myelopathy or radiculopathy, lumbar region: Secondary | ICD-10-CM | POA: Diagnosis not present

## 2018-08-06 DIAGNOSIS — M5136 Other intervertebral disc degeneration, lumbar region: Secondary | ICD-10-CM | POA: Diagnosis not present

## 2018-08-06 DIAGNOSIS — M47816 Spondylosis without myelopathy or radiculopathy, lumbar region: Secondary | ICD-10-CM | POA: Diagnosis not present

## 2018-08-06 DIAGNOSIS — G894 Chronic pain syndrome: Secondary | ICD-10-CM | POA: Diagnosis not present

## 2018-08-06 DIAGNOSIS — Z79891 Long term (current) use of opiate analgesic: Secondary | ICD-10-CM | POA: Diagnosis not present

## 2018-09-30 DIAGNOSIS — M5136 Other intervertebral disc degeneration, lumbar region: Secondary | ICD-10-CM | POA: Diagnosis not present

## 2018-09-30 DIAGNOSIS — G894 Chronic pain syndrome: Secondary | ICD-10-CM | POA: Diagnosis not present

## 2018-09-30 DIAGNOSIS — M47816 Spondylosis without myelopathy or radiculopathy, lumbar region: Secondary | ICD-10-CM | POA: Diagnosis not present

## 2018-11-25 DIAGNOSIS — M47816 Spondylosis without myelopathy or radiculopathy, lumbar region: Secondary | ICD-10-CM | POA: Diagnosis not present

## 2018-11-25 DIAGNOSIS — G894 Chronic pain syndrome: Secondary | ICD-10-CM | POA: Diagnosis not present

## 2018-11-25 DIAGNOSIS — M5136 Other intervertebral disc degeneration, lumbar region: Secondary | ICD-10-CM | POA: Diagnosis not present

## 2019-01-04 DIAGNOSIS — M79672 Pain in left foot: Secondary | ICD-10-CM | POA: Diagnosis not present

## 2019-01-04 DIAGNOSIS — M79671 Pain in right foot: Secondary | ICD-10-CM | POA: Diagnosis not present

## 2019-01-04 DIAGNOSIS — M722 Plantar fascial fibromatosis: Secondary | ICD-10-CM | POA: Diagnosis not present

## 2019-01-06 ENCOUNTER — Other Ambulatory Visit: Payer: Self-pay | Admitting: Student

## 2019-01-06 DIAGNOSIS — M79671 Pain in right foot: Secondary | ICD-10-CM

## 2019-01-06 DIAGNOSIS — M79672 Pain in left foot: Secondary | ICD-10-CM

## 2019-01-10 ENCOUNTER — Ambulatory Visit
Admission: RE | Admit: 2019-01-10 | Discharge: 2019-01-10 | Disposition: A | Discharge status: Home / Self Care | Payer: 59 | Source: Ambulatory Visit | Attending: Student | Admitting: Student

## 2019-01-10 DIAGNOSIS — M79672 Pain in left foot: Secondary | ICD-10-CM

## 2019-01-10 DIAGNOSIS — M79671 Pain in right foot: Secondary | ICD-10-CM

## 2019-01-10 DIAGNOSIS — R2241 Localized swelling, mass and lump, right lower limb: Secondary | ICD-10-CM | POA: Diagnosis not present

## 2019-01-26 DIAGNOSIS — M47816 Spondylosis without myelopathy or radiculopathy, lumbar region: Secondary | ICD-10-CM | POA: Diagnosis not present

## 2019-01-26 DIAGNOSIS — G894 Chronic pain syndrome: Secondary | ICD-10-CM | POA: Diagnosis not present

## 2019-01-26 DIAGNOSIS — M5136 Other intervertebral disc degeneration, lumbar region: Secondary | ICD-10-CM | POA: Diagnosis not present

## 2019-02-03 DIAGNOSIS — M722 Plantar fascial fibromatosis: Secondary | ICD-10-CM | POA: Diagnosis not present

## 2019-02-12 ENCOUNTER — Other Ambulatory Visit: Payer: Self-pay | Admitting: Physician Assistant

## 2019-02-12 DIAGNOSIS — R1903 Right lower quadrant abdominal swelling, mass and lump: Secondary | ICD-10-CM | POA: Diagnosis not present

## 2019-02-12 DIAGNOSIS — R1031 Right lower quadrant pain: Secondary | ICD-10-CM

## 2019-02-17 ENCOUNTER — Ambulatory Visit
Admission: RE | Admit: 2019-02-17 | Discharge: 2019-02-17 | Disposition: A | Discharge status: Home / Self Care | Payer: 59 | Source: Ambulatory Visit | Attending: Physician Assistant | Admitting: Physician Assistant

## 2019-02-17 DIAGNOSIS — R1031 Right lower quadrant pain: Secondary | ICD-10-CM | POA: Diagnosis not present

## 2019-03-31 ENCOUNTER — Ambulatory Visit
Admission: RE | Admit: 2019-03-31 | Discharge: 2019-03-31 | Disposition: A | Discharge status: Home / Self Care | Payer: 59 | Source: Ambulatory Visit | Attending: Family Medicine | Admitting: Family Medicine

## 2019-03-31 ENCOUNTER — Other Ambulatory Visit: Payer: Self-pay

## 2019-03-31 ENCOUNTER — Other Ambulatory Visit: Payer: Self-pay | Admitting: Family Medicine

## 2019-03-31 DIAGNOSIS — K4090 Unilateral inguinal hernia, without obstruction or gangrene, not specified as recurrent: Secondary | ICD-10-CM

## 2019-04-01 DIAGNOSIS — R12 Heartburn: Secondary | ICD-10-CM | POA: Diagnosis not present

## 2019-04-01 DIAGNOSIS — K5790 Diverticulosis of intestine, part unspecified, without perforation or abscess without bleeding: Secondary | ICD-10-CM | POA: Diagnosis not present

## 2019-10-19 IMAGING — MR MR FOOT*L* W/O CM
4 of 5 series · 11 of 40 positions shown · non-contrast
Comparison: None.

CLINICAL DATA: Palpable masses on the plantar aspect of the
midfoot.

EXAM:
MRI OF THE LEFT FOOT WITHOUT CONTRAST
TECHNIQUE: Multiplanar, multisequence MR imaging of the left foot/ankle was
performed. No intravenous contrast was administered.

[Series 3: PD fat-sat · axial · left · 3.0mm · 0.25mm/px · z∈[-69,+10]mm · 3 of 30 slices shown]
[im 5/30]
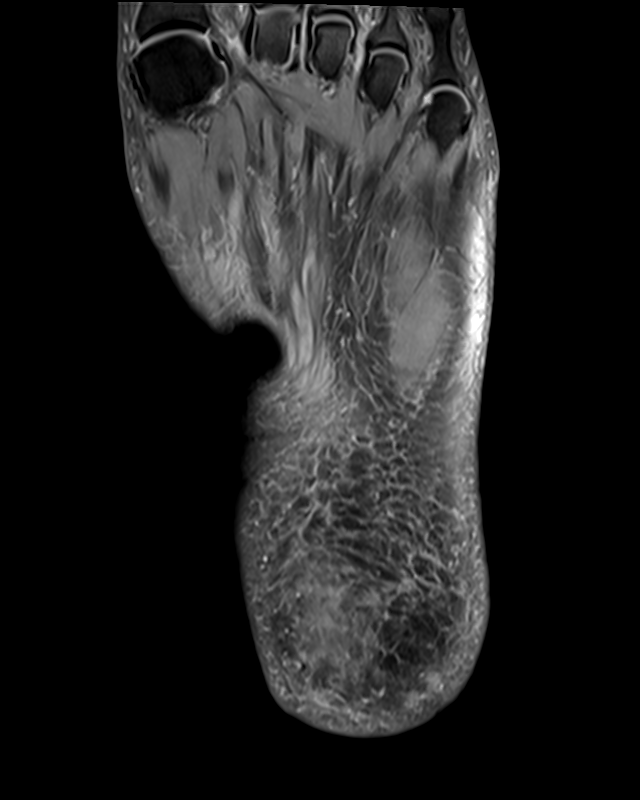
[im 15/30]
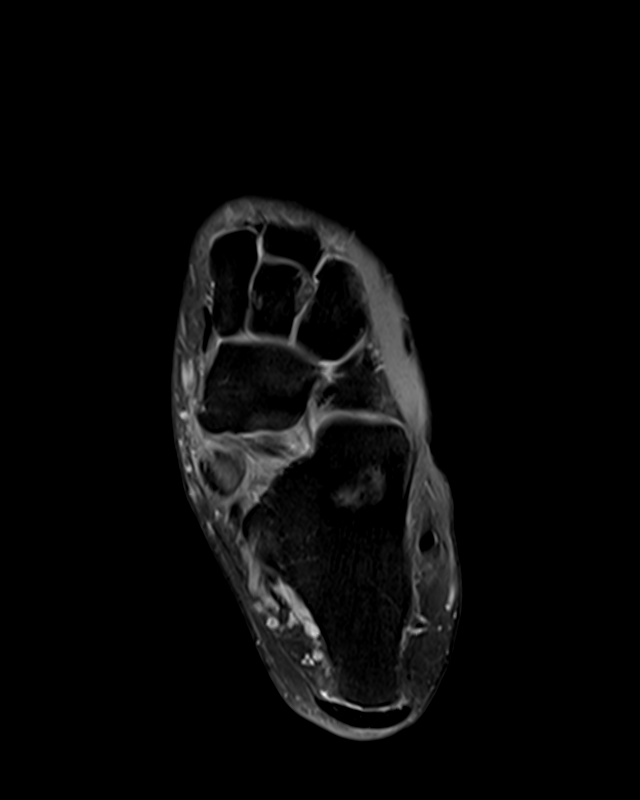
[im 25/30]
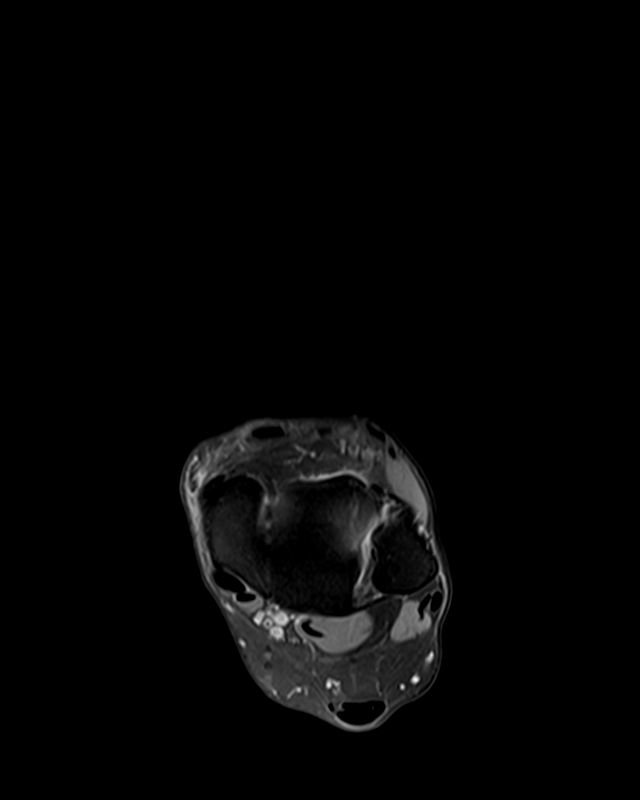

[Series 4: T2 fat-sat · axial · left · 3.0mm · 0.25mm/px · z∈[-69,+10]mm · 3 of 30 slices shown (1 of 2)]
[im 5/30]
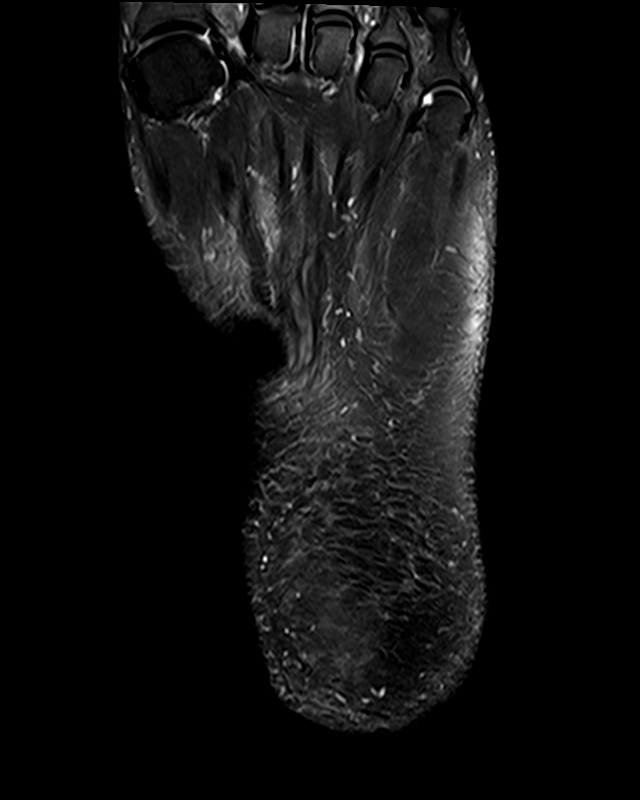
[im 17/30]
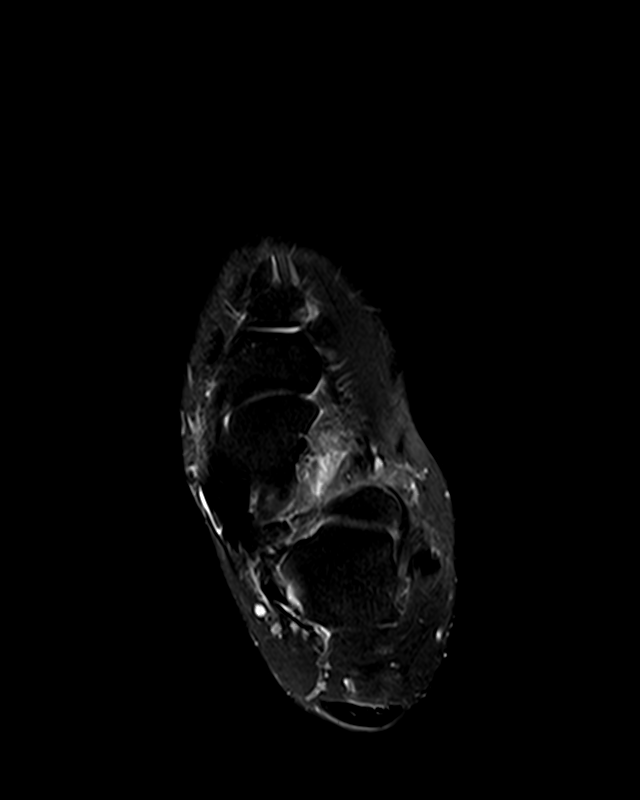
[im 25/30]
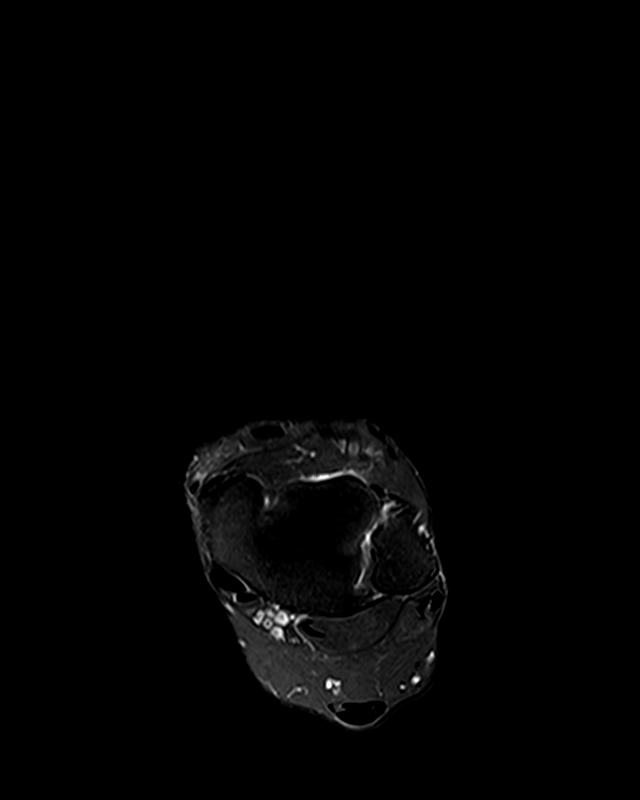

[Series 5: T1 · sagittal · left · 4.0mm · 0.27mm/px · 2 of 24 slices shown]
[im 4/24]
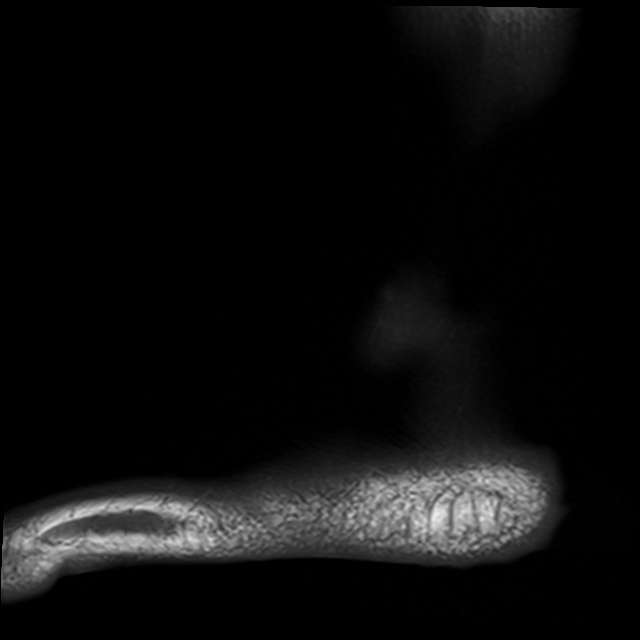
[im 12/24]
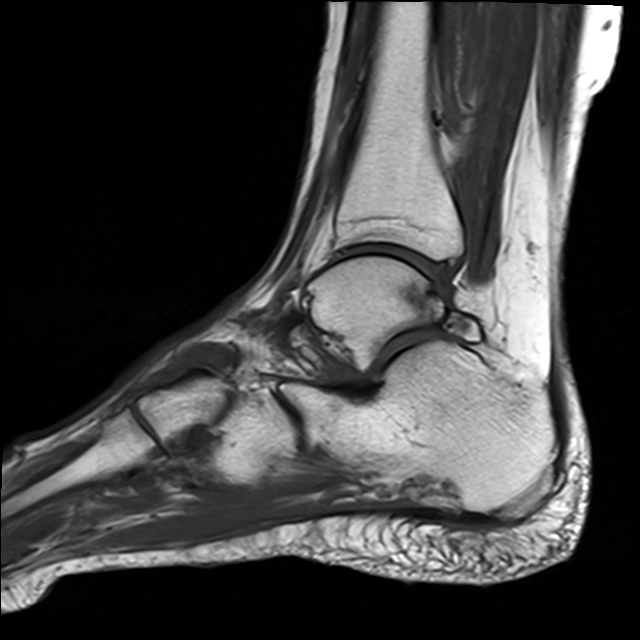

[Series 7: T2 fat-sat · coronal · left · 3.0mm · 0.25mm/px · 3 of 39 slices shown (2 of 2)]
[im 4/39]
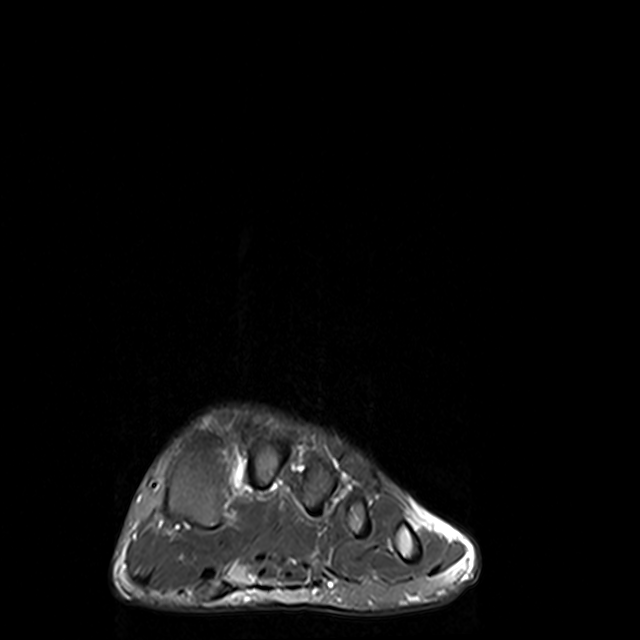
[im 20/39]
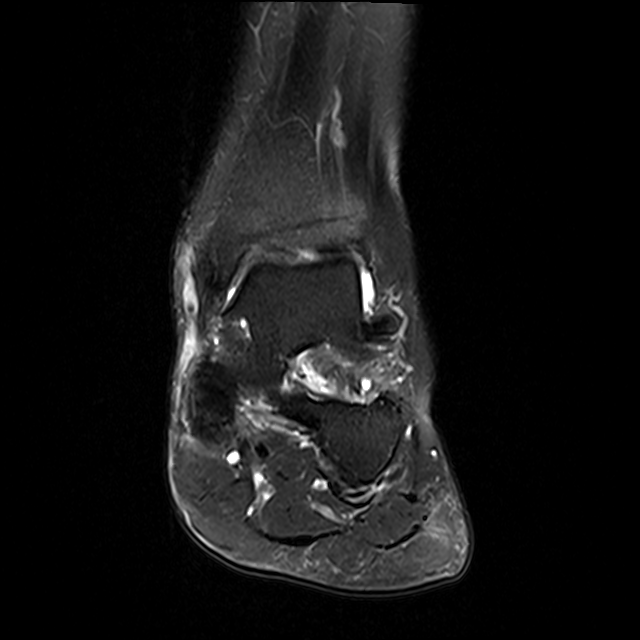
[im 35/39]
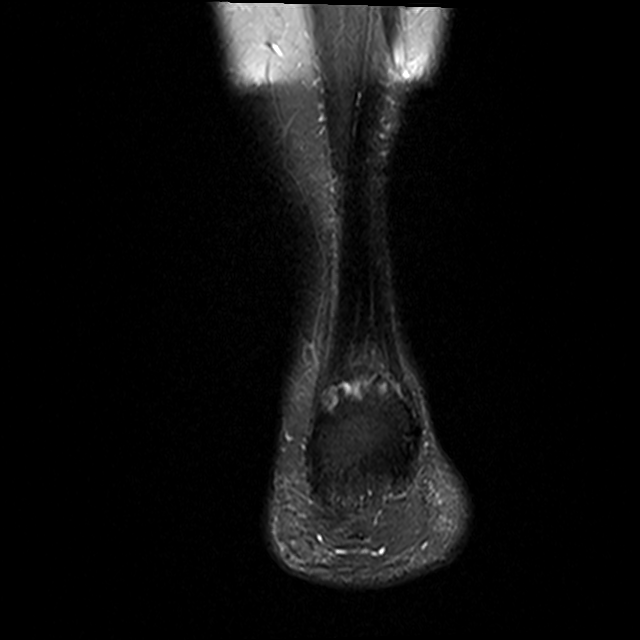

[11 of 40 positions shown; findings below may reference images not displayed]

FINDINGS: TENDONS

Peroneal: Intact.

Posteromedial: Intact. Mild tenosynovitis involving the posterior
tibialis tendon.

Anterior: Intact.

Achilles: Intact. Mild tendinopathy and mild retrocalcaneal
bursitis.

Plantar Fascia: The patient's palpable abnormality is marked with a
vitamin-E capsule. This corresponds to a plantar fibroma measuring
approximately 18.5 x 7.5 mm. Smaller fibromas are noted slightly
more distally. The plantar fascia is intact to the calcaneus. No
findings for plantar fasciitis.

LIGAMENTS

Lateral: Intact

Medial: Intact

CARTILAGE

Ankle Joint: Mild ankle joint degenerative changes but no joint
effusion or osteochondral lesion. Findings suspicious for os
trigonum syndrome with mild increased T2 signal intensity in the os
trigonum and also in the adjacent posterior aspect of the calcaneus.

Subtalar Joints/Sinus Tarsi: The subtalar joints are maintained. The
sinus tarsi is normal.

Bones: No acute bony findings.

Other: Normal appearance of the foot and ankle musculature.
IMPRESSION: 1. MR findings consistent with plantar fibromatosis.
2. Mild degenerative changes at the ankle joint.
3. MR findings suggest os trigonum syndrome.

## 2022-01-22 ENCOUNTER — Other Ambulatory Visit: Payer: Self-pay | Admitting: Family Medicine

## 2022-01-22 DIAGNOSIS — R109 Unspecified abdominal pain: Secondary | ICD-10-CM

## 2022-01-22 DIAGNOSIS — M7989 Other specified soft tissue disorders: Secondary | ICD-10-CM

## 2022-01-24 ENCOUNTER — Ambulatory Visit
Admission: RE | Admit: 2022-01-24 | Discharge: 2022-01-24 | Disposition: A | Discharge status: Home / Self Care | Payer: Managed Care, Other (non HMO) | Source: Ambulatory Visit | Attending: Family Medicine | Admitting: Family Medicine

## 2022-01-24 DIAGNOSIS — M7989 Other specified soft tissue disorders: Secondary | ICD-10-CM

## 2022-02-15 ENCOUNTER — Ambulatory Visit
Admission: RE | Admit: 2022-02-15 | Discharge: 2022-02-15 | Disposition: A | Discharge status: Home / Self Care | Payer: Managed Care, Other (non HMO) | Source: Ambulatory Visit | Attending: Family Medicine | Admitting: Family Medicine

## 2022-02-15 DIAGNOSIS — R109 Unspecified abdominal pain: Secondary | ICD-10-CM

## 2022-02-15 MED ORDER — IOPAMIDOL (ISOVUE-300) INJECTION 61%
100.0000 mL | Freq: Once | INTRAVENOUS | Status: AC | PRN
  Administered 2022-02-15: 100 mL via INTRAVENOUS

## 2022-10-15 ENCOUNTER — Other Ambulatory Visit: Payer: Self-pay | Admitting: Family Medicine

## 2022-10-15 DIAGNOSIS — R222 Localized swelling, mass and lump, trunk: Secondary | ICD-10-CM

## 2022-10-16 ENCOUNTER — Other Ambulatory Visit: Payer: Self-pay | Admitting: Family Medicine

## 2022-10-16 ENCOUNTER — Ambulatory Visit
Admission: RE | Admit: 2022-10-16 | Discharge: 2022-10-16 | Disposition: A | Discharge status: Home / Self Care | Payer: 59 | Source: Ambulatory Visit | Attending: Family Medicine | Admitting: Family Medicine

## 2022-10-16 DIAGNOSIS — R222 Localized swelling, mass and lump, trunk: Secondary | ICD-10-CM

## 2022-10-16 MED ORDER — IOPAMIDOL (ISOVUE-300) INJECTION 61%
75.0000 mL | Freq: Once | INTRAVENOUS | Status: AC | PRN
  Administered 2022-10-16: 75 mL via INTRAVENOUS

## 2022-10-18 ENCOUNTER — Other Ambulatory Visit: Payer: Self-pay | Admitting: Family Medicine

## 2022-10-18 DIAGNOSIS — R229 Localized swelling, mass and lump, unspecified: Secondary | ICD-10-CM

## 2022-10-18 DIAGNOSIS — R222 Localized swelling, mass and lump, trunk: Secondary | ICD-10-CM

## 2022-10-24 ENCOUNTER — Ambulatory Visit
Admission: RE | Admit: 2022-10-24 | Discharge: 2022-10-24 | Disposition: A | Discharge status: Home / Self Care | Payer: 59 | Source: Ambulatory Visit | Attending: Family Medicine | Admitting: Family Medicine

## 2022-10-24 DIAGNOSIS — R229 Localized swelling, mass and lump, unspecified: Secondary | ICD-10-CM

## 2022-10-24 DIAGNOSIS — R222 Localized swelling, mass and lump, trunk: Secondary | ICD-10-CM

## 2022-10-30 ENCOUNTER — Other Ambulatory Visit: Payer: Self-pay | Admitting: Family Medicine

## 2022-10-30 DIAGNOSIS — Z1231 Encounter for screening mammogram for malignant neoplasm of breast: Secondary | ICD-10-CM

## 2022-11-05 ENCOUNTER — Ambulatory Visit
Admission: RE | Admit: 2022-11-05 | Discharge: 2022-11-05 | Disposition: A | Discharge status: Home / Self Care | Payer: 59 | Source: Ambulatory Visit | Attending: Family Medicine | Admitting: Family Medicine

## 2022-11-05 DIAGNOSIS — Z1231 Encounter for screening mammogram for malignant neoplasm of breast: Secondary | ICD-10-CM
